# Patient Record
Sex: Female | Born: 1975 | Race: Black or African American | Hispanic: No | Marital: Single | Smoking: Never smoker
Health system: Southern US, Community
[De-identification: ages and names within clinical notes are randomized; demographics above are authoritative.]

## PROBLEM LIST (undated history)

## (undated) DIAGNOSIS — Z9289 Personal history of other medical treatment: Secondary | ICD-10-CM

## (undated) DIAGNOSIS — I10 Essential (primary) hypertension: Secondary | ICD-10-CM

## (undated) HISTORY — DX: Personal history of other medical treatment: Z92.89

## (undated) HISTORY — DX: Essential (primary) hypertension: I10

---

## 2009-12-10 ENCOUNTER — Emergency Department (HOSPITAL_BASED_OUTPATIENT_CLINIC_OR_DEPARTMENT_OTHER): Admission: EM | Admit: 2009-12-10 | Discharge: 2009-12-11 | Payer: Self-pay | Admitting: Emergency Medicine

## 2009-12-11 ENCOUNTER — Ambulatory Visit: Payer: Self-pay | Admitting: Radiology

## 2010-12-17 LAB — DIFFERENTIAL
Lymphocytes Relative: 26 % (ref 12–46)
Monocytes Absolute: 0.8 10*3/uL (ref 0.1–1.0)
Neutro Abs: 5.4 10*3/uL (ref 1.7–7.7)
Neutrophils Relative %: 56 % (ref 43–77)

## 2010-12-17 LAB — URINALYSIS, ROUTINE W REFLEX MICROSCOPIC
Glucose, UA: NEGATIVE mg/dL
Nitrite: NEGATIVE
Urobilinogen, UA: 1 mg/dL (ref 0.0–1.0)

## 2010-12-17 LAB — CBC
Hemoglobin: 11.7 g/dL — ABNORMAL LOW (ref 12.0–15.0)
MCHC: 32.9 g/dL (ref 30.0–36.0)
RBC: 3.96 MIL/uL (ref 3.87–5.11)
WBC: 9.6 10*3/uL (ref 4.0–10.5)

## 2010-12-17 LAB — WET PREP, GENITAL: Trich, Wet Prep: NONE SEEN

## 2010-12-17 LAB — GC/CHLAMYDIA PROBE AMP, GENITAL: Chlamydia, DNA Probe: NEGATIVE

## 2017-08-05 ENCOUNTER — Encounter (HOSPITAL_COMMUNITY): Payer: Self-pay | Admitting: Emergency Medicine

## 2017-08-05 ENCOUNTER — Ambulatory Visit (HOSPITAL_COMMUNITY)
Admission: EM | Admit: 2017-08-05 | Discharge: 2017-08-05 | Disposition: A | Discharge status: Home / Self Care | Payer: Medicaid Other | Attending: Emergency Medicine | Admitting: Emergency Medicine

## 2017-08-05 DIAGNOSIS — S39012A Strain of muscle, fascia and tendon of lower back, initial encounter: Secondary | ICD-10-CM | POA: Diagnosis not present

## 2017-08-05 DIAGNOSIS — M542 Cervicalgia: Secondary | ICD-10-CM | POA: Diagnosis not present

## 2017-08-05 DIAGNOSIS — S8002XA Contusion of left knee, initial encounter: Secondary | ICD-10-CM

## 2017-08-05 MED ORDER — NAPROXEN 500 MG PO TABS: 500.0000 mg | ORAL_TABLET | Freq: Two times a day (BID) | ORAL | 0 refills | Status: DC
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
## 2017-08-05 NOTE — Discharge Instructions (Signed)
If symptoms do not get better after 1 week call ortho  Take pain meds as needed May use ice and heat to knee

## 2017-08-06 ENCOUNTER — Encounter (HOSPITAL_COMMUNITY): Payer: Self-pay | Admitting: *Deleted

## 2017-08-06 ENCOUNTER — Emergency Department (HOSPITAL_COMMUNITY): Payer: No Typology Code available for payment source

## 2017-08-06 ENCOUNTER — Emergency Department (HOSPITAL_COMMUNITY)
Admission: EM | Admit: 2017-08-06 | Discharge: 2017-08-07 | Disposition: A | Discharge status: Home / Self Care | Payer: No Typology Code available for payment source | Attending: Emergency Medicine | Admitting: Emergency Medicine

## 2017-08-06 ENCOUNTER — Other Ambulatory Visit: Payer: Self-pay

## 2017-08-06 DIAGNOSIS — S161XXA Strain of muscle, fascia and tendon at neck level, initial encounter: Secondary | ICD-10-CM | POA: Diagnosis not present

## 2017-08-06 DIAGNOSIS — Y998 Other external cause status: Secondary | ICD-10-CM | POA: Insufficient documentation

## 2017-08-06 DIAGNOSIS — S8392XA Sprain of unspecified site of left knee, initial encounter: Secondary | ICD-10-CM | POA: Insufficient documentation

## 2017-08-06 DIAGNOSIS — Y92481 Parking lot as the place of occurrence of the external cause: Secondary | ICD-10-CM | POA: Diagnosis not present

## 2017-08-06 DIAGNOSIS — Y939 Activity, unspecified: Secondary | ICD-10-CM | POA: Diagnosis not present

## 2017-08-06 DIAGNOSIS — S199XXA Unspecified injury of neck, initial encounter: Secondary | ICD-10-CM | POA: Diagnosis present

## 2017-08-06 LAB — I-STAT BETA HCG BLOOD, ED (MC, WL, AP ONLY)

## 2017-08-06 MED ORDER — KETOROLAC TROMETHAMINE 60 MG/2ML IM SOLN
60.0000 mg | Freq: Once | INTRAMUSCULAR | Status: AC
  Administered 2017-08-06: 60 mg via INTRAMUSCULAR
  Filled 2017-08-06: qty 2

## 2017-08-06 MED ORDER — METHOCARBAMOL 500 MG PO TABS
1000.0000 mg | ORAL_TABLET | Freq: Once | ORAL | Status: AC
  Administered 2017-08-06: 1000 mg via ORAL
  Filled 2017-08-06: qty 2

## 2017-08-06 MED ORDER — METHOCARBAMOL 500 MG PO TABS: 1000.0000 mg | ORAL_TABLET | Freq: Three times a day (TID) | ORAL | 0 refills | Status: DC | PRN

## 2017-08-06 MED ORDER — TRAMADOL HCL 50 MG PO TABS: 50.0000 mg | ORAL_TABLET | Freq: Four times a day (QID) | ORAL | 0 refills | Status: DC | PRN
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
## 2017-08-06 NOTE — ED Notes (Signed)
Pt was seen at [facility] for the same yesterday, last took the naproxen prescribed this morning for the pain.

## 2017-08-06 NOTE — ED Notes (Signed)
Ortho tech aware of knee immobilizer and crutches.

## 2017-08-06 NOTE — ED Notes (Signed)
Patient verbalized understanding of discharge instructions and denies any further needs or questions at this time. VS stable. Patient ambulatory with steady gait. Escorted to ED entrance in wheelchair.   

## 2017-08-06 NOTE — ED Notes (Signed)
Ortho at bedside.
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
## 2017-08-06 NOTE — Progress Notes (Signed)
Orthopedic Tech Progress Note Patient Details:  Jennifer Bowers 06-30-1976 [id number]  Ortho Devices Type of Ortho Device: Knee Immobilizer, Crutches Ortho Device/Splint Location: lle Ortho Device/Splint Interventions: Ordered, Application, Adjustment   Karolee Stamps 08/06/2017, 11:59 PM

## 2017-11-06 ENCOUNTER — Ambulatory Visit (HOSPITAL_COMMUNITY)
Admission: EM | Admit: 2017-11-06 | Discharge: 2017-11-06 | Disposition: A | Discharge status: Home / Self Care | Payer: Medicaid Other | Attending: Internal Medicine | Admitting: Internal Medicine

## 2017-11-06 ENCOUNTER — Encounter (HOSPITAL_COMMUNITY): Payer: Self-pay | Admitting: Emergency Medicine

## 2017-11-06 ENCOUNTER — Other Ambulatory Visit: Payer: Self-pay

## 2017-11-06 DIAGNOSIS — R6889 Other general symptoms and signs: Secondary | ICD-10-CM

## 2017-11-06 MED ORDER — FLUTICASONE PROPIONATE 50 MCG/ACT NA SUSP: 2.0000 | Freq: Every day | NASAL | 0 refills | Status: DC

## 2017-11-06 MED ORDER — OSELTAMIVIR PHOSPHATE 75 MG PO CAPS: 75.0000 mg | ORAL_CAPSULE | Freq: Two times a day (BID) | ORAL | 0 refills | Status: AC

## 2017-11-06 MED ORDER — MELOXICAM 7.5 MG PO TABS: 7.5000 mg | ORAL_TABLET | Freq: Every day | ORAL | 0 refills | Status: DC

## 2017-11-06 NOTE — Discharge Instructions (Signed)
Start tamiflu for possible influenza. Mobic for pain. Start flonase for nasal congestion/drainage. You can use over the counter nasal saline rinse such as neti pot for nasal congestion. Keep hydrated, your urine should be clear to pale yellow in color. Tylenol for fever and pain. Monitor for any worsening of symptoms, chest pain, shortness of breath, wheezing, swelling of the throat, follow up for reevaluation.   For sore throat try using a honey-based tea. Use 3 teaspoons of honey with juice squeezed from half lemon. Place shaved pieces of ginger into 1/2-1 cup of water and warm over stove top. Then mix the ingredients and repeat every 4 hours as needed.

## 2017-11-06 NOTE — ED Provider Notes (Signed)
[facility]    CSN: [id number] Arrival date & time: 11/06/17  1626     History   Chief Complaint Chief Complaint  Patient presents with  . Otalgia    HPI Jennifer Bowers is a 42 y.o. female.   42 year old female comes in for 1 day history of flulike symptoms.  She has had nasal congestion, rhinorrhea, productive cough, body aches, bilateral ear pain, fever.  She is also had some dizziness with chest soreness with cough.  Denies syncope, seizures.  Denies one-sided weakness.  Denies chest pain, shortness of breath, wheezing.  Fever, T-max 103, has not taken any medications.  Positive sick contact.  Never smoker.      History reviewed. No pertinent past medical history.  There are no active problems to display for this patient.   History reviewed. No pertinent surgical history.  OB History    No data available       Home Medications    Prior to Admission medications   Medication Sig Start Date End Date Taking? Authorizing Provider  fluticasone (FLONASE) 50 MCG/ACT nasal spray Place 2 sprays into both nostrils daily. 11/06/17   Tasia Catchings, Antony Sian V, PA-C  meloxicam (MOBIC) 7.5 MG tablet Take 1 tablet (7.5 mg total) by mouth daily. 11/06/17   Tasia Catchings, Cervando Durnin V, PA-C  methocarbamol (ROBAXIN) 500 MG tablet Take 2 tablets (1,000 mg total) every 8 (eight) hours as needed by mouth for muscle spasms. 08/06/17   Julianne Rice, MD  naproxen (NAPROSYN) 500 MG tablet Take 1 tablet (500 mg total) 2 (two) times daily by mouth. 08/05/17   Marney Setting, NP  oseltamivir (TAMIFLU) 75 MG capsule Take 1 capsule (75 mg total) by mouth 2 (two) times daily for 5 days. 11/06/17 11/11/17  Ok Edwards, PA-C  traMADol (ULTRAM) 50 MG tablet Take 1 tablet (50 mg total) every 6 (six) hours as needed by mouth for severe pain. 08/06/17   Julianne Rice, MD    Family History Family History  Problem Relation Age of Onset  . Healthy Mother     Social History Social History   Tobacco Use  . Smoking  status: Never Smoker  Substance Use Topics  . Alcohol use: No    Frequency: Never  . Drug use: No     Allergies   Penicillins   Review of Systems Review of Systems  Reason unable to perform ROS: See HPI as above.     Physical Exam Triage Vital Signs ED Triage Vitals  Enc Vitals Group     BP 11/06/17 1735 (!) 141/93     Pulse Rate 11/06/17 1735 (!) 112     Resp 11/06/17 1735 (!) 24     Temp 11/06/17 1735 100.3 F (37.9 C)     Temp Source 11/06/17 1735 Oral     SpO2 11/06/17 1735 100 %     Weight --      Height --      Head Circumference --      Peak Flow --      Pain Score 11/06/17 1732 10     Pain Loc --      Pain Edu? --      Excl. in [place]? --    No data found.  Updated Vital Signs BP (!) 145/86 (BP Location: Right Arm)   Pulse (!) 115   Temp 100.3 F (37.9 C) (Oral)   Resp 20   SpO2 100%   Physical Exam  Constitutional: She is oriented  to person, place, and time. She appears well-developed and well-nourished. No distress.  HENT:  Head: Normocephalic and atraumatic.  Right Ear: External ear and ear canal normal. Tympanic membrane is erythematous. Tympanic membrane is not bulging.  Left Ear: External ear and ear canal normal. Tympanic membrane is erythematous. Tympanic membrane is not bulging.  Nose: Mucosal edema and rhinorrhea present. Right sinus exhibits maxillary sinus tenderness and frontal sinus tenderness. Left sinus exhibits maxillary sinus tenderness and frontal sinus tenderness.  Mouth/Throat: Uvula is midline, oropharynx is clear and moist and mucous membranes are normal.  Eyes: Conjunctivae and EOM are normal. Pupils are equal, round, and reactive to light.  Neck: Normal range of motion. Neck supple.  Cardiovascular: Regular rhythm and normal heart sounds. Tachycardia present. Exam reveals no gallop and no friction rub.  No murmur heard. Pulmonary/Chest: Effort normal and breath sounds normal. She has no decreased breath sounds. She has no  wheezes. She has no rhonchi. She has no rales.  Lymphadenopathy:    She has no cervical adenopathy.  Neurological: She is alert and oriented to person, place, and time. She has normal strength. She is not disoriented. Coordination and gait normal. GCS eye subscore is 4. GCS verbal subscore is 5. GCS motor subscore is 6.  Skin: Skin is warm and dry.  Psychiatric: She has a normal mood and affect. Her behavior is normal. Judgment normal.    UC Treatments / Results  Labs (all labs ordered are listed, but only abnormal results are displayed) Labs Reviewed - No data to display  EKG  EKG Interpretation None       Radiology No results found.  Procedures Procedures (including critical care time)  Medications Ordered in UC Medications - No data to display   Initial Impression / Assessment and Plan / UC Course  I have reviewed the triage vital signs and the nursing notes.  Pertinent labs & imaging results that were available during my care of the patient were reviewed by me and considered in my medical decision making (see chart for details).    History and exam consistent with flulike illness.  Patient would like to proceed with Tamiflu treatment.  Start Tamiflu as directed.  Other symptomatic treatment discussed.  Push fluids.  Return precautions given.  Patient expresses understanding and agrees to plan.  Final Clinical Impressions(s) / UC Diagnoses   Final diagnoses:  Flu-like symptoms    ED Discharge Orders        Ordered    oseltamivir (TAMIFLU) 75 MG capsule  2 times daily     11/06/17 1857    fluticasone (FLONASE) 50 MCG/ACT nasal spray  Daily     11/06/17 1857    meloxicam (MOBIC) 7.5 MG tablet  Daily     11/06/17 1857        Ok Edwards, PA-C 11/06/17 2154

## 2017-11-06 NOTE — ED Triage Notes (Signed)
Fever and bilateral ear pain and reports fever of 103.  Dizziness, stuffy nose, chest soreness with cough

## 2018-05-05 ENCOUNTER — Emergency Department (HOSPITAL_COMMUNITY): Payer: Medicaid Other

## 2018-05-05 ENCOUNTER — Encounter (HOSPITAL_COMMUNITY): Payer: Self-pay | Admitting: *Deleted

## 2018-05-05 ENCOUNTER — Encounter (HOSPITAL_COMMUNITY): Payer: Self-pay

## 2018-05-05 ENCOUNTER — Emergency Department (HOSPITAL_COMMUNITY)
Admission: EM | Admit: 2018-05-05 | Discharge: 2018-05-05 | Disposition: A | Discharge status: Home / Self Care | Payer: Medicaid Other | Attending: Emergency Medicine | Admitting: Emergency Medicine

## 2018-05-05 ENCOUNTER — Ambulatory Visit (HOSPITAL_COMMUNITY)
Admission: EM | Admit: 2018-05-05 | Discharge: 2018-05-05 | Disposition: A | Discharge status: Home / Self Care | Payer: Medicaid Other | Attending: Family Medicine | Admitting: Family Medicine

## 2018-05-05 DIAGNOSIS — R519 Headache, unspecified: Secondary | ICD-10-CM

## 2018-05-05 DIAGNOSIS — R51 Headache: Secondary | ICD-10-CM

## 2018-05-05 LAB — CBC WITH DIFFERENTIAL/PLATELET
ABS IMMATURE GRANULOCYTES: 0 10*3/uL (ref 0.0–0.1)
BASOS ABS: 0.1 10*3/uL (ref 0.0–0.1)
BASOS PCT: 1 %
Eosinophils Absolute: 0.3 10*3/uL (ref 0.0–0.7)
Eosinophils Relative: 3 %
HCT: 30.7 % — ABNORMAL LOW (ref 36.0–46.0)
HEMOGLOBIN: 9 g/dL — AB (ref 12.0–15.0)
IMMATURE GRANULOCYTES: 0 %
Lymphocytes Relative: 27 %
Lymphs Abs: 2.7 10*3/uL (ref 0.7–4.0)
MCH: 23 pg — AB (ref 26.0–34.0)
MCHC: 29.3 g/dL — ABNORMAL LOW (ref 30.0–36.0)
MCV: 78.3 fL (ref 78.0–100.0)
MONO ABS: 0.9 10*3/uL (ref 0.1–1.0)
Monocytes Relative: 9 %
NEUTROS ABS: 5.8 10*3/uL (ref 1.7–7.7)
NEUTROS PCT: 60 %
PLATELETS: 504 10*3/uL — AB (ref 150–400)
RBC: 3.92 MIL/uL (ref 3.87–5.11)
RDW: 17.1 % — ABNORMAL HIGH (ref 11.5–15.5)
WBC: 9.8 10*3/uL (ref 4.0–10.5)

## 2018-05-05 LAB — COMPREHENSIVE METABOLIC PANEL
ALBUMIN: 3.4 g/dL — AB (ref 3.5–5.0)
ALT: 16 U/L (ref 0–44)
AST: 23 U/L (ref 15–41)
Alkaline Phosphatase: 56 U/L (ref 38–126)
Anion gap: 9 (ref 5–15)
BUN: 11 mg/dL (ref 6–20)
CHLORIDE: 108 mmol/L (ref 98–111)
CO2: 22 mmol/L (ref 22–32)
CREATININE: 0.89 mg/dL (ref 0.44–1.00)
Calcium: 8.9 mg/dL (ref 8.9–10.3)
GFR calc non Af Amer: >60 mL/min (ref >60)
GLUCOSE: 89 mg/dL (ref 70–99)
Potassium: 4.2 mmol/L (ref 3.5–5.1)
SODIUM: 139 mmol/L (ref 135–145)
Total Bilirubin: 0.5 mg/dL (ref 0.3–1.2)
Total Protein: 7 g/dL (ref 6.5–8.1)

## 2018-05-05 LAB — I-STAT BETA HCG BLOOD, ED (MC, WL, AP ONLY): I-stat hCG, quantitative: <5 m[IU]/mL (ref <5)

## 2018-05-05 MED ORDER — DIPHENHYDRAMINE HCL 50 MG/ML IJ SOLN
25.0000 mg | Freq: Once | INTRAMUSCULAR | Status: AC
  Administered 2018-05-05: 25 mg via INTRAVENOUS
  Filled 2018-05-05: qty 1

## 2018-05-05 MED ORDER — KETOROLAC TROMETHAMINE 30 MG/ML IJ SOLN
30.0000 mg | Freq: Once | INTRAMUSCULAR | Status: AC
  Administered 2018-05-05: 30 mg via INTRAVENOUS
  Filled 2018-05-05: qty 1

## 2018-05-05 MED ORDER — TRAMADOL HCL 50 MG PO TABS: ORAL_TABLET | ORAL | 0 refills | Status: DC

## 2018-05-05 MED ORDER — METOCLOPRAMIDE HCL 5 MG/ML IJ SOLN
10.0000 mg | Freq: Once | INTRAMUSCULAR | Status: AC
  Administered 2018-05-05: 10 mg via INTRAVENOUS
  Filled 2018-05-05: qty 2

## 2018-05-05 NOTE — ED Triage Notes (Signed)
Pt  Presents with ongoing headache, fatigue and general itchiness

## 2018-05-05 NOTE — Discharge Instructions (Addendum)
Recommend further evaluation and management at the ED Patient and mother in agreement with this plan.  Going to ED by private vehicle.

## 2018-05-05 NOTE — ED Notes (Signed)
Signature pad unavailable at time of pt discharge. Pt verbalized understanding of d/c instructions. Work note given. Denied any further requests

## 2018-05-05 NOTE — ED Notes (Signed)
Per catherine, RN lab reports CMP hemolyzed, additional samples sent

## 2018-05-05 NOTE — ED Notes (Signed)
ED Provider at bedside. 

## 2018-05-05 NOTE — ED Provider Notes (Signed)
[facility] EMERGENCY DEPARTMENT Provider Note   CSN: [id number] Arrival date & time: 05/05/18  1359     History   Chief Complaint Chief Complaint  Patient presents with  . Headache    HPI Jennifer Bowers is a 42 y.o. female.  Patient complains of severe headache for last couple days.  For the next liver had some nausea.  No blurred vision  The history is provided by the patient. No language interpreter was used.  Headache   This is a new problem. The current episode started yesterday. The problem occurs constantly. The problem has not changed since onset.The headache is associated with nothing. The pain is located in the bilateral region. The quality of the pain is described as dull. The pain is at a severity of 5/10. The pain is moderate. The pain does not radiate. Pertinent negatives include no anorexia. She has tried nothing for the symptoms. The treatment provided no relief.    History reviewed. No pertinent past medical history.  There are no active problems to display for this patient.   History reviewed. No pertinent surgical history.   OB History   None      Home Medications    Prior to Admission medications   Medication Sig Start Date End Date Taking? Authorizing Provider  fluticasone (FLONASE) 50 MCG/ACT nasal spray Place 2 sprays into both nostrils daily. Patient not taking: Reported on 05/05/2018 11/06/17   Ok Edwards, PA-C  meloxicam (MOBIC) 7.5 MG tablet Take 1 tablet (7.5 mg total) by mouth daily. Patient not taking: Reported on 05/05/2018 11/06/17   Ok Edwards, PA-C  methocarbamol (ROBAXIN) 500 MG tablet Take 2 tablets (1,000 mg total) every 8 (eight) hours as needed by mouth for muscle spasms. Patient not taking: Reported on 05/05/2018 08/06/17   Julianne Rice, MD  naproxen (NAPROSYN) 500 MG tablet Take 1 tablet (500 mg total) 2 (two) times daily by mouth. Patient not taking: Reported on 05/05/2018 08/05/17   Marney Setting, NP    traMADol Veatrice Bourbon) 50 MG tablet Take 1 every 8 hours if needed for headache not relieved by Tylenol 05/05/18   Milton Ferguson, MD    Family History Family History  Problem Relation Age of Onset  . Healthy Mother     Social History Social History   Tobacco Use  . Smoking status: Never Smoker  Substance Use Topics  . Alcohol use: No    Frequency: Never  . Drug use: No     Allergies   Penicillins   Review of Systems Review of Systems  Constitutional: Negative for appetite change and fatigue.  HENT: Negative for congestion, ear discharge and sinus pressure.   Eyes: Negative for discharge.  Respiratory: Negative for cough.   Cardiovascular: Negative for chest pain.  Gastrointestinal: Negative for abdominal pain, anorexia and diarrhea.  Genitourinary: Negative for frequency and hematuria.  Musculoskeletal: Negative for back pain.  Skin: Negative for rash.  Neurological: Positive for headaches. Negative for seizures.  Psychiatric/Behavioral: Negative for hallucinations.     Physical Exam Updated Vital Signs BP 120/83 (BP Location: Left Arm)   Pulse 72   Temp 98.5 F (36.9 C) (Oral)   Resp 16   SpO2 98%   Physical Exam  Constitutional: She is oriented to person, place, and time. She appears well-developed.  HENT:  Head: Normocephalic.  Eyes: Conjunctivae and EOM are normal. No scleral icterus.  Neck: Neck supple. No thyromegaly present.  Cardiovascular: Normal rate and regular rhythm.  Exam reveals no gallop and no friction rub.  No murmur heard. Pulmonary/Chest: No stridor. She has no wheezes. She has no rales. She exhibits no tenderness.  Abdominal: She exhibits no distension. There is no tenderness. There is no rebound.  Musculoskeletal: Normal range of motion. She exhibits no edema.  Lymphadenopathy:    She has no cervical adenopathy.  Neurological: She is oriented to person, place, and time. She exhibits normal muscle tone. Coordination normal.  Skin: No  rash noted. No erythema.  Psychiatric: She has a normal mood and affect. Her behavior is normal.     ED Treatments / Results  Labs (all labs ordered are listed, but only abnormal results are displayed) Labs Reviewed  CBC WITH DIFFERENTIAL/PLATELET - Abnormal; Notable for the following components:      Result Value   Hemoglobin 9.0 (*)    HCT 30.7 (*)    MCH 23.0 (*)    MCHC 29.3 (*)    RDW 17.1 (*)    Platelets 504 (*)    All other components within normal limits  COMPREHENSIVE METABOLIC PANEL - Abnormal; Notable for the following components:   Albumin 3.4 (*)    All other components within normal limits  I-STAT BETA HCG BLOOD, ED ([facility], [facility], [facility] ONLY)    EKG None  Radiology Ct Head Wo Contrast  Result Date: 05/05/2018 CLINICAL DATA:  Acute onset severe headache.  Fatigue. EXAM: CT HEAD WITHOUT CONTRAST TECHNIQUE: Contiguous axial images were obtained from the base of the skull through the vertex without intravenous contrast. COMPARISON:  None. FINDINGS: Brain: The ventricles are normal in size and configuration. There is no intracranial mass, hemorrhage, extra-axial fluid collection, or midline shift. The gray-white compartments are normal. No evident acute infarct. Minimal basal ganglia calcification is likely physiologic. Vascular: No hyperdense vessel.  No evident vascular calcification. Skull: The bony calvarium appears intact. Sinuses/Orbits: There is a retention cyst in the inferior right maxillary antrum. There is mucosal thickening in several ethmoid air cells. There is mucosal thickening in the right sphenoid sinus. Other visualized paranasal sinuses are clear. Orbits appear symmetric bilaterally. Other: Mastoid air cells are clear. IMPRESSION: Areas of paranasal sinus disease.  Study otherwise unremarkable. Electronically Signed   By: Lowella Grip III M.D.   On: 05/05/2018 17:15    Procedures Procedures (including critical care time)  Medications Ordered in  ED Medications  ketorolac (TORADOL) 30 MG/ML injection 30 mg (30 mg Intravenous Given 05/05/18 1722)  diphenhydrAMINE (BENADRYL) injection 25 mg (25 mg Intravenous Given 05/05/18 1722)  metoCLOPramide (REGLAN) injection 10 mg (10 mg Intravenous Given 05/05/18 1722)     Initial Impression / Assessment and Plan / ED Course  I have reviewed the triage vital signs and the nursing notes.  Pertinent labs & imaging results that were available during my care of the patient were reviewed by me and considered in my medical decision making (see chart for details).     CBC see med CT had all unremarkable except for cysts in her sinuses.  Patient improved with migraine cocktail.  She will follow-up with ENT and is given some Ultram for pain if needed  Final Clinical Impressions(s) / ED Diagnoses   Final diagnoses:  Bad headache    ED Discharge Orders         Ordered    traMADol (ULTRAM) 50 MG tablet     05/05/18 2023           Milton Ferguson, MD 05/05/18 2025

## 2018-05-05 NOTE — Discharge Instructions (Signed)
Follow-up with Dr. Erik Obey so he can evaluate your sinuses.Marland Kitchen

## 2018-05-05 NOTE — ED Provider Notes (Addendum)
[facility]   [id number] 05/05/18 Arrival Time: 1303  SUBJECTIVE:  Jennifer Bowers is a 42 y.o. female who complains of migraine for 3-4 days.  Denies a precipitating event, or recent head trauma.  Patient localizes her pain to the temples.  Describes the pain as intermittent and sharp in character.  Patient states pain is a 10/10. Patient has tried OTC ibuprofen without relief. Symptoms are made worse with daily activies.  Denies similar symptoms in the past.  This is the worst headache of their life.  Complains of associated fatigue, nausea, vomiting x 3 episodes, watery eyes, and SOB.  Patient denies fever, chills, aura, rhinorrhea, chest pain, weakness, numbness or tingling.    ROS: As per HPI.  History reviewed. No pertinent past medical history. History reviewed. No pertinent surgical history. Allergies  Allergen Reactions  . Penicillins    No current facility-administered medications on file prior to encounter.    Current Outpatient Medications on File Prior to Encounter  Medication Sig Dispense Refill  . fluticasone (FLONASE) 50 MCG/ACT nasal spray Place 2 sprays into both nostrils daily. 1 g 0  . meloxicam (MOBIC) 7.5 MG tablet Take 1 tablet (7.5 mg total) by mouth daily. 15 tablet 0  . methocarbamol (ROBAXIN) 500 MG tablet Take 2 tablets (1,000 mg total) every 8 (eight) hours as needed by mouth for muscle spasms. 30 tablet 0  . naproxen (NAPROSYN) 500 MG tablet Take 1 tablet (500 mg total) 2 (two) times daily by mouth. 30 tablet 0  . traMADol (ULTRAM) 50 MG tablet Take 1 tablet (50 mg total) every 6 (six) hours as needed by mouth for severe pain. 15 tablet 0   Social History   Socioeconomic History  . Marital status: Single    Spouse name: Not on file  . Number of children: Not on file  . Years of education: Not on file  . Highest education level: Not on file  Occupational History  . Not on file  Social Needs  . Financial resource strain: Not on file  . Food  insecurity:    Worry: Not on file    Inability: Not on file  . Transportation needs:    Medical: Not on file    Non-medical: Not on file  Tobacco Use  . Smoking status: Never Smoker  Substance and Sexual Activity  . Alcohol use: No    Frequency: Never  . Drug use: No  . Sexual activity: Not on file  Lifestyle  . Physical activity:    Days per week: Not on file    Minutes per session: Not on file  . Stress: Not on file  Relationships  . Social connections:    Talks on phone: Not on file    Gets together: Not on file    Attends religious service: Not on file    Active member of club or organization: Not on file    Attends meetings of clubs or organizations: Not on file    Relationship status: Not on file  . Intimate partner violence:    Fear of current or ex partner: Not on file    Emotionally abused: Not on file    Physically abused: Not on file    Forced sexual activity: Not on file  Other Topics Concern  . Not on file  Social History Narrative  . Not on file   Family History  Problem Relation Age of Onset  . Healthy Mother     OBJECTIVE:  Vitals:  05/05/18 1320  BP: 130/81  Pulse: 93  Resp: 20  Temp: 98.1 F (36.7 C)  TempSrc: Oral  SpO2: 100%    General appearance: AO to person, place, and date; in on obvious distress; using mobile device without difficulty Eyes: PERRLA; EOMI HENT: normocephalic; atraumatic Neck: supple with FROM Lungs: clear to auscultation bilaterally Heart: Systolic murmur heard.  Radial pulses 2+ symmetrical bilaterally Abdomen: nondistended; soft; normal active bowel sounds; nontender to light or deep palpation; no guarding Extremities: no edema; symmetrical with no gross deformities Skin: warm and dry Neurologic: CN 2-12 grossly intact; finger to nose without difficulty; negative pronator drift; normal gait; strength and sensation intact bilaterally about the upper and lower extremities Psychological: alert and cooperative;  normal mood and affect  ASSESSMENT & PLAN:  1. Worst headache of life     No orders of the defined types were placed in this encounter.  Due to patient description of headache I am recommending further evaluation and management in the ED Patient and mother in agreement with this plan.  Going to ED by private vehicle.      Lestine Box, PA-C 05/05/18 1349

## 2018-05-05 NOTE — ED Triage Notes (Signed)
Pt in c/o intermittent headache for the last two days, pain is sharp, also nausea with this, denies photophobia

## 2018-05-05 NOTE — ED Notes (Signed)
Patient transported to CT 

## 2019-12-30 ENCOUNTER — Encounter (HOSPITAL_COMMUNITY): Payer: Self-pay

## 2019-12-30 ENCOUNTER — Other Ambulatory Visit: Payer: Self-pay

## 2019-12-30 ENCOUNTER — Ambulatory Visit (HOSPITAL_COMMUNITY)
Admission: EM | Admit: 2019-12-30 | Discharge: 2019-12-30 | Disposition: A | Discharge status: Home / Self Care | Payer: Medicaid Other | Attending: Family Medicine | Admitting: Family Medicine

## 2019-12-30 DIAGNOSIS — Z20822 Contact with and (suspected) exposure to covid-19: Secondary | ICD-10-CM

## 2019-12-30 DIAGNOSIS — Z1152 Encounter for screening for COVID-19: Secondary | ICD-10-CM

## 2019-12-30 DIAGNOSIS — U071 COVID-19: Secondary | ICD-10-CM | POA: Diagnosis not present

## 2019-12-30 NOTE — ED Triage Notes (Signed)
Patient significant other tested positive for COVID today.

## 2019-12-30 NOTE — Discharge Instructions (Addendum)
Your COVID test is pending.  You should self quarantine until the test result is back.    Take Tylenol as needed for fever or discomfort.  Rest and keep yourself hydrated.    Go to the emergency department if you develop shortness of breath, severe diarrhea, high fever not relieved by Tylenol or ibuprofen, or other concerning symptoms.    

## 2019-12-30 NOTE — ED Provider Notes (Signed)
[facility]    CSN: [id number] Arrival date & time: 12/30/19  1645      History   Chief Complaint Chief Complaint  Patient presents with  . Covid Exposure    HPI Jennifer Bowers is a 44 y.o. female.   Patient reports that her significant other was Covid tested and was reported positive today.  Patient states that her significant other was tested about 3 days ago.  Reports that she had been around him before this, and that he was symptomatic with fever, cough, fatigue, weakness.  Patient denies any symptoms today.  Denies headache, cough, shortness of breath, chest tightness, nausea, vomiting, diarrhea, fever, rash, other symptoms.  ROS per HPI  The history is provided by the patient.    History reviewed. No pertinent past medical history.  There are no problems to display for this patient.   History reviewed. No pertinent surgical history.  OB History   No obstetric history on file.      Home Medications    Prior to Admission medications   Medication Sig Start Date End Date Taking? Authorizing Provider  fluticasone (FLONASE) 50 MCG/ACT nasal spray Place 2 sprays into both nostrils daily. Patient not taking: Reported on 05/05/2018 11/06/17   Ok Edwards, PA-C  meloxicam (MOBIC) 7.5 MG tablet Take 1 tablet (7.5 mg total) by mouth daily. Patient not taking: Reported on 05/05/2018 11/06/17   Ok Edwards, PA-C  methocarbamol (ROBAXIN) 500 MG tablet Take 2 tablets (1,000 mg total) every 8 (eight) hours as needed by mouth for muscle spasms. Patient not taking: Reported on 05/05/2018 08/06/17   Julianne Rice, MD  naproxen (NAPROSYN) 500 MG tablet Take 1 tablet (500 mg total) 2 (two) times daily by mouth. Patient not taking: Reported on 05/05/2018 08/05/17   Marney Setting, NP  traMADol Veatrice Bourbon) 50 MG tablet Take 1 every 8 hours if needed for headache not relieved by Tylenol 05/05/18   Milton Ferguson, MD    Family History Family History  Problem Relation Age of  Onset  . Healthy Mother     Social History Social History   Tobacco Use  . Smoking status: Never Smoker  . Smokeless tobacco: Never Used  Substance Use Topics  . Alcohol use: No  . Drug use: No     Allergies   Penicillins   Review of Systems Review of Systems   Physical Exam Triage Vital Signs ED Triage Vitals  Enc Vitals Group     BP 12/30/19 1710 128/88     Pulse Rate 12/30/19 1710 88     Resp 12/30/19 1710 14     Temp 12/30/19 1710 97.8 F (36.6 C)     Temp Source 12/30/19 1710 Oral     SpO2 12/30/19 1710 100 %     Weight --      Height --      Head Circumference --      Peak Flow --      Pain Score 12/30/19 1709 0     Pain Loc --      Pain Edu? --      Excl. in [place]? --    No data found.  Updated Vital Signs BP 128/88 (BP Location: Left Arm)   Pulse 88   Temp 97.8 F (36.6 C) (Oral)   Resp 14   LMP 12/04/2019 (Exact Date)   SpO2 100%   Visual Acuity Right Eye Distance:   Left Eye Distance:   Bilateral Distance:  Right Eye Near:   Left Eye Near:    Bilateral Near:     Physical Exam Vitals and nursing note reviewed.  Constitutional:      General: She is not in acute distress.    Appearance: Normal appearance. She is well-developed. She is not ill-appearing.  HENT:     Head: Normocephalic and atraumatic.     Right Ear: Tympanic membrane normal.     Left Ear: Tympanic membrane normal.     Nose: Nose normal.     Mouth/Throat:     Mouth: Mucous membranes are moist.     Pharynx: Oropharynx is clear.  Eyes:     Extraocular Movements: Extraocular movements intact.     Conjunctiva/sclera: Conjunctivae normal.     Pupils: Pupils are equal, round, and reactive to light.  Cardiovascular:     Rate and Rhythm: Normal rate and regular rhythm.     Heart sounds: No murmur.  Pulmonary:     Effort: Pulmonary effort is normal. No respiratory distress.     Breath sounds: Normal breath sounds.  Abdominal:     General: Bowel sounds are normal. There  is no distension.     Palpations: Abdomen is soft. There is no mass.     Tenderness: There is no abdominal tenderness. There is no right CVA tenderness, left CVA tenderness, guarding or rebound.     Hernia: No hernia is present.  Musculoskeletal:        General: Normal range of motion.     Cervical back: Normal range of motion and neck supple.  Skin:    General: Skin is warm and dry.     Capillary Refill: Capillary refill takes less than 2 seconds.  Neurological:     General: No focal deficit present.     Mental Status: She is alert and oriented to person, place, and time.  Psychiatric:        Mood and Affect: Mood normal.        Behavior: Behavior normal.        Thought Content: Thought content normal.      UC Treatments / Results  Labs (all labs ordered are listed, but only abnormal results are displayed) Labs Reviewed  SARS CORONAVIRUS 2 (TAT 6-24 HRS)    EKG   Radiology No results found.  Procedures Procedures (including critical care time)  Medications Ordered in UC Medications - No data to display  Initial Impression / Assessment and Plan / UC Course  I have reviewed the triage vital signs and the nursing notes.  Pertinent labs & imaging results that were available during my care of the patient were reviewed by me and considered in my medical decision making (see chart for details).     Covid screen: Patient has close exposure to COVID-19 virus.  Significant other tested positive for Covid today.  Patient is a symptomatic, physical exam benign.  Covid swab obtained in office today, patient instructed to quarantine until results are back and negative.  Instructed patient in negative results will be available on MyChart.  Also instructed that if her results are positive, she will usually hear from our office.  Instructed to follow-up with primary care as needed.  Instructed to follow-up with the ER for trouble swallowing, trouble breathing, other concerning  symptoms. Final Clinical Impressions(s) / UC Diagnoses   Final diagnoses:  Close exposure to COVID-19 virus  Encounter for screening for COVID-19     Discharge Instructions     Your COVID  test is pending.  You should self quarantine until the test result is back.    Take Tylenol as needed for fever or discomfort.  Rest and keep yourself hydrated.    Go to the emergency department if you develop shortness of breath, severe diarrhea, high fever not relieved by Tylenol or ibuprofen, or other concerning symptoms.       ED Prescriptions    None     PDMP not reviewed this encounter.   Faustino Congress, NP 12/30/19 1753

## 2019-12-31 LAB — SARS CORONAVIRUS 2 (TAT 6-24 HRS): SARS Coronavirus 2: POSITIVE — AB

## 2020-01-18 ENCOUNTER — Ambulatory Visit (HOSPITAL_COMMUNITY)
Admission: EM | Admit: 2020-01-18 | Discharge: 2020-01-18 | Discharge status: Absent without leave | Payer: Medicaid Other

## 2020-03-08 ENCOUNTER — Ambulatory Visit (HOSPITAL_COMMUNITY)
Admission: EM | Admit: 2020-03-08 | Discharge: 2020-03-08 | Disposition: A | Discharge status: Home / Self Care | Payer: Self-pay | Attending: Family Medicine | Admitting: Family Medicine

## 2020-03-08 ENCOUNTER — Encounter (HOSPITAL_COMMUNITY): Payer: Self-pay | Admitting: Emergency Medicine

## 2020-03-08 ENCOUNTER — Other Ambulatory Visit: Payer: Self-pay

## 2020-03-08 DIAGNOSIS — G500 Trigeminal neuralgia: Secondary | ICD-10-CM

## 2020-03-08 MED ORDER — PREDNISONE 10 MG (21) PO TBPK: ORAL_TABLET | Freq: Every day | ORAL | 0 refills | Status: DC

## 2020-03-08 NOTE — ED Triage Notes (Signed)
History of TMJ 2 months ago.  Patient has had right jaw pain and ear pain for 2 months.  Medication did not help pain

## 2020-03-08 NOTE — Discharge Instructions (Addendum)
We will try to treat the pain and inflammation with long prednisone taper. You may need stronger medication in the future. This may be short term fix.  You need to see your primary care for possible referral to neurology.

## 2020-03-09 NOTE — ED Provider Notes (Signed)
Jennifer Bowers    CSN: [id number] Arrival date & time: 03/08/20  0913      History   Chief Complaint Chief Complaint  Patient presents with   Otalgia    HPI Jennifer Bowers is a 44 y.o. female.   Pt is a 44 year old female that presents with right ear pain, jaw pain, facial pain. This has been ongoing x 2 months. This has been intermittent. Describes as sharp and stabbing when the pain comes. She has been taking ibuprofen which helps some from time to time. No facial swelling, tooth pain or fevers. Was told possible TMJ. No specific pain with chewing, opening the mouth.   ROS per HPI      History reviewed. No pertinent past medical history.  There are no problems to display for this patient.   History reviewed. No pertinent surgical history.  OB History   No obstetric history on file.      Home Medications    Prior to Admission medications   Medication Sig Start Date End Date Taking? Authorizing Provider  predniSONE (STERAPRED UNI-PAK 21 TAB) 10 MG (21) TBPK tablet Take by mouth daily. Take 6 tabs by mouth daily  for 2 days, then 5 tabs for 2 days, then 4 tabs for 2 days, then 3 tabs for 2 days, 2 tabs for 2 days, then 1 tab by mouth daily for 2 days 03/08/20   Loura Halt A, NP  fluticasone (FLONASE) 50 MCG/ACT nasal spray Place 2 sprays into both nostrils daily. Patient not taking: Reported on 05/05/2018 11/06/17 03/08/20  Arturo Morton    Family History Family History  Problem Relation Age of Onset   Healthy Mother     Social History Social History   Tobacco Use   Smoking status: Never Smoker   Smokeless tobacco: Never Used  Scientific laboratory technician Use: Never used  Substance Use Topics   Alcohol use: No   Drug use: No     Allergies   Penicillins   Review of Systems Review of Systems   Physical Exam Triage Vital Signs ED Triage Vitals  Enc Vitals Group     BP 03/08/20 0941 (!) 144/80     Pulse Rate 03/08/20 0941 90     Resp  03/08/20 0941 18     Temp 03/08/20 0941 97.7 F (36.5 C)     Temp Source 03/08/20 0941 Oral     SpO2 03/08/20 0941 100 %     Weight --      Height --      Head Circumference --      Peak Flow --      Pain Score 03/08/20 0943 10     Pain Loc --      Pain Edu? --      Excl. in [place]? --    No data found.  Updated Vital Signs BP (!) 144/80 (BP Location: Left Arm)    Pulse 90    Temp 97.7 F (36.5 C) (Oral)    Resp 18    LMP 02/25/2020    SpO2 100%   Visual Acuity Right Eye Distance:   Left Eye Distance:   Bilateral Distance:    Right Eye Near:   Left Eye Near:    Bilateral Near:     Physical Exam Vitals and nursing note reviewed.  Constitutional:      General: She is not in acute distress.    Appearance: Normal appearance. She is  not ill-appearing, toxic-appearing or diaphoretic.  HENT:     Head: Normocephalic.     Nose: Nose normal.     Mouth/Throat:     Comments: No specific tenderness with palpation of the TMJ Cracking, popping No facial swelling  Eyes:     Conjunctiva/sclera: Conjunctivae normal.  Pulmonary:     Effort: Pulmonary effort is normal.  Musculoskeletal:        General: Normal range of motion.     Cervical back: Normal range of motion.  Skin:    General: Skin is warm and dry.     Findings: No rash.  Neurological:     Mental Status: She is alert.  Psychiatric:        Mood and Affect: Mood normal.      UC Treatments / Results  Labs (all labs ordered are listed, but only abnormal results are displayed) Labs Reviewed - No data to display  EKG   Radiology No results found.  Procedures Procedures (including critical care time)  Medications Ordered in UC Medications - No data to display  Initial Impression / Assessment and Plan / UC Course  I have reviewed the triage vital signs and the nursing notes.  Pertinent labs & imaging results that were available during my care of the patient were reviewed by me and considered in my medical  decision making (see chart for details).     Trigeminal neuralgia.  Most likely diagnosis.  Do not believe this is TMJ syndrome We will try to treat this with long prednisone taper Inform patient that she needs to see a specialist and she may need different kind of medication to treat this. Recommended follow-up with primary care Information given about trigeminal neuralgia  Follow up as needed for continued or worsening symptoms  Final Clinical Impressions(s) / UC Diagnoses   Final diagnoses:  Trigeminal neuralgia of right side of face     Discharge Instructions     We will try to treat the pain and inflammation with long prednisone taper. You may need stronger medication in the future. This may be short term fix.  You need to see your primary care for possible referral to neurology.     ED Prescriptions    Medication Sig Dispense Auth. Provider   predniSONE (STERAPRED UNI-PAK 21 TAB) 10 MG (21) TBPK tablet Take by mouth daily. Take 6 tabs by mouth daily  for 2 days, then 5 tabs for 2 days, then 4 tabs for 2 days, then 3 tabs for 2 days, 2 tabs for 2 days, then 1 tab by mouth daily for 2 days 42 tablet Phillp Dolores A, NP     PDMP not reviewed this encounter.   Orvan July, NP 03/09/20 [phone]

## 2020-05-26 LAB — CYTOLOGY - PAP: Pap: NEGATIVE

## 2020-05-30 ENCOUNTER — Other Ambulatory Visit: Payer: Self-pay | Admitting: Obstetrics and Gynecology

## 2020-05-31 ENCOUNTER — Other Ambulatory Visit: Payer: Self-pay | Admitting: Obstetrics and Gynecology

## 2020-06-01 ENCOUNTER — Inpatient Hospital Stay (HOSPITAL_COMMUNITY): Admission: RE | Admit: 2020-06-01 | Payer: Medicaid Other | Source: Ambulatory Visit

## 2020-08-24 ENCOUNTER — Telehealth: Payer: Self-pay | Admitting: Medical

## 2020-08-24 NOTE — Telephone Encounter (Signed)
Pt called in and had to resch her appt from 08-25-20 to 09-01-20 due to appt in [place], [place] for a blood transfusion 08-25-20 at 730am.

## 2020-08-25 ENCOUNTER — Encounter: Payer: Self-pay | Admitting: Obstetrics & Gynecology

## 2020-08-30 ENCOUNTER — Encounter: Payer: Self-pay | Admitting: *Deleted

## 2020-09-01 ENCOUNTER — Ambulatory Visit (INDEPENDENT_AMBULATORY_CARE_PROVIDER_SITE_OTHER): Payer: Self-pay | Admitting: Medical

## 2020-09-01 ENCOUNTER — Other Ambulatory Visit: Payer: Self-pay

## 2020-09-01 ENCOUNTER — Encounter: Payer: Self-pay | Admitting: Medical

## 2020-09-01 VITALS — BP 152/107 | HR 96 | Ht 68.0 in | Wt 251.0 lb

## 2020-09-01 DIAGNOSIS — D219 Benign neoplasm of connective and other soft tissue, unspecified: Secondary | ICD-10-CM

## 2020-09-01 DIAGNOSIS — N939 Abnormal uterine and vaginal bleeding, unspecified: Secondary | ICD-10-CM

## 2020-09-01 NOTE — Progress Notes (Signed)
   History:  Ms. Jennifer Bowers is a 44 y.o. [id number] who presents to clinic today for AUB. Patient was seen initially at [facility] and decided to leave the practice due to concerns with the provider. She went to [place] and had a normal pap smear, GC/CHlamydia testing, TSH this year. She could no longer go to the practice due to her insurance. She states she has known large fibroids which were noted on [place] in 2011 (in [place]) and she has very heavy bleeding for 2 days each month with large clots. During the last 3-4 months her periods have also become irregular. She was unable to tolerate Ebx at [facility]. She had blood transfusion on 11/29 for Hgb 5.9. She tried OCPs for a short time and felt it helped some with bleeding control, but she did not like the side effects.   The following portions of the patient's history were reviewed and updated as appropriate: allergies, current medications, family history, past medical history, social history, past surgical history and problem list.  Review of Systems:  Review of Systems  Constitutional: Positive for malaise/fatigue. Negative for chills and fever.  Gastrointestinal: Negative for abdominal pain.  Genitourinary:       + vaginal bleeding      Objective:  Physical Exam BP (!) 152/107   Pulse 96   Ht 5\' 8"  (1.727 m)   Wt 251 lb (113.9 kg)   LMP 09/01/2020   BMI 38.16 kg/m  Physical Exam Vitals and nursing note reviewed.  Constitutional:      General: She is not in acute distress.    Appearance: She is well-developed and well-nourished.  HENT:     Head: Normocephalic and atraumatic.  Cardiovascular:     Rate and Rhythm: Normal rate.  Pulmonary:     Effort: Pulmonary effort is normal.  Abdominal:     General: There is no distension.     Palpations: Abdomen is soft.  Skin:    General: Skin is warm and dry.     Findings: No erythema.  Neurological:     Mental Status: She is alert and oriented to person, place, and time.  Psychiatric:         Mood and Affect: Mood and affect normal.       Assessment & Plan:  1. Fibroids - US PELVIC COMPLETE WITH TRANSVAGINAL; Future  2. Abnormal uterine bleeding (AUB) - US PELVIC COMPLETE WITH TRANSVAGINAL; Future - CBC - Patient to return to [place] after [place] for appointment with a surgeon  Approximately 25 minutes of total time was spent with this patient on history taking, coordination of care and documentation.   Luvenia Redden, PA-C 09/01/2020 3:48 PM

## 2020-09-01 NOTE — Patient Instructions (Signed)
Uterine Fibroids  Uterine fibroids (leiomyomas) are noncancerous (benign) tumors that can develop in the uterus. Fibroids may also develop in the fallopian tubes, cervix, or tissues (ligaments) near the uterus. You may have one or many fibroids. Fibroids vary in size, weight, and where they grow in the uterus. Some can become quite large. Most fibroids do not require medical treatment. What are the causes? The cause of this condition is not known. What increases the risk? You are more likely to develop this condition if you:  Are in your 30s or 40s and have not gone through menopause.  Have a family history of this condition.  Are of African-American descent.  Had your first period at an early age (early menarche).  Have not had any children (nulliparity).  Are overweight or obese. What are the signs or symptoms? Many women do not have any symptoms. Symptoms of this condition may include:  Heavy menstrual bleeding.  Bleeding or spotting between periods.  Pain and pressure in the pelvic area, between the hips.  Bladder problems, such as needing to urinate urgently or more often than usual.  Inability to have children (infertility).  Failure to carry pregnancy to term (miscarriage). How is this diagnosed? This condition may be diagnosed based on:  Your symptoms and medical history.  A physical exam.  A pelvic exam that includes feeling for any tumors.  Imaging tests, such as ultrasound or MRI. How is this treated? Treatment for this condition may include:  Seeing your health care provider for follow-up visits to monitor your fibroids for any changes.  Taking NSAIDs such as ibuprofen, naproxen, or aspirin to reduce pain.  Hormone medicines. These may be taken as a pill, given in an injection, or delivered by a T-shaped device that is inserted into the uterus (intrauterine device, IUD).  Surgery to remove one of the following: ? The fibroids (myomectomy). Your health  care provider may recommend this if fibroids affect your fertility and you want to become pregnant. ? The uterus (hysterectomy). ? Blood supply to the fibroids (uterine artery embolization). Follow these instructions at home:  Take over-the-counter and prescription medicines only as told by your health care provider.  Ask your health care provider if you should take iron pills or eat more iron-rich foods, such as dark green, leafy vegetables. Heavy menstrual bleeding can cause low iron levels.  If directed, apply heat to your back or abdomen to reduce pain. Use the heat source that your health care provider recommends, such as a moist heat pack or a heating pad. ? Place a towel between your skin and the heat source. ? Leave the heat on for 20-30 minutes. ? Remove the heat if your skin turns bright red. This is especially important if you are unable to feel pain, heat, or cold. You may have a greater risk of getting burned.  Pay close attention to your menstrual cycle. Tell your health care provider about any changes, such as: ? Increased blood flow that requires you to use more pads or tampons than usual. ? A change in the number of days that your period lasts. ? A change in symptoms that are associated with your period, such as back pain or cramps in your abdomen.  Keep all follow-up visits as told by your health care provider. This is important, especially if your fibroids need to be monitored for any changes. Contact a health care provider if you:  Have pelvic pain, back pain, or cramps in your abdomen that   do not get better with medicine or heat.  Develop new bleeding between periods.  Have increased bleeding during or between periods.  Feel unusually tired or weak.  Feel light-headed. Get help right away if you:  Faint.  Have pelvic pain that suddenly gets worse.  Have severe vaginal bleeding that soaks a tampon or pad in 30 minutes or less. Summary  Uterine fibroids are  noncancerous (benign) tumors that can develop in the uterus.  The exact cause of this condition is not known.  Most fibroids do not require medical treatment unless they affect your ability to have children (fertility).  Contact a health care provider if you have pelvic pain, back pain, or cramps in your abdomen that do not get better with medicines.  Make sure you know what symptoms should cause you to get help right away. This information is not intended to replace advice given to you by your health care provider. Make sure you discuss any questions you have with your health care provider. Document Revised: 08/22/2017 Document Reviewed: 08/05/2017 Elsevier Patient Education  [place]. Abnormal Uterine Bleeding Abnormal uterine bleeding means bleeding more than usual from your uterus. It can include:  Bleeding between periods.  Bleeding after sex.  Bleeding that is heavier than normal.  Periods that last longer than usual.  Bleeding after you have stopped having your period (menopause). There are many problems that may cause this. You should see a doctor for any kind of bleeding that is not normal. Treatment depends on the cause of the bleeding. Follow these instructions at home:  Watch your condition for any changes.  Do not use tampons, douche, or have sex, if your doctor tells you not to.  Change your pads often.  Get regular well-woman exams. Make sure they include a pelvic exam and cervical cancer screening.  Keep all follow-up visits as told by your doctor. This is important. Contact a doctor if:  The bleeding lasts more than one week.  You feel dizzy at times.  You feel like you are going to throw up (nauseous).  You throw up. Get help right away if:  You pass out.  You have to change pads every hour.  You have belly (abdominal) pain.  You have a fever.  You get sweaty.  You get weak.  You passing large blood clots from your  vagina. Summary  Abnormal uterine bleeding means bleeding more than usual from your uterus.  There are many problems that may cause this. You should see a doctor for any kind of bleeding that is not normal.  Treatment depends on the cause of the bleeding. This information is not intended to replace advice given to you by your health care provider. Make sure you discuss any questions you have with your health care provider. Document Revised: 09/03/2016 Document Reviewed: 09/03/2016 Elsevier Patient Education  2020 [employer].

## 2020-09-01 NOTE — Progress Notes (Signed)
Referred here for bleeding issues. States has fibroids and has heavy bleeding. Had blood transfusion recently. Was supposed to have hysterectomy last year but wasn't happy with provider.   Now transferred because of her insurance. Has periods every month with lots of clotting and very heavy bleeding and moderate pain. Torrence Hammack,RN

## 2020-09-02 LAB — CBC
Hematocrit: 34.9 % (ref 34.0–46.6)
Hemoglobin: 9.7 g/dL — ABNORMAL LOW (ref 11.1–15.9)
MCH: 19.9 pg — ABNORMAL LOW (ref 26.6–33.0)
MCHC: 27.8 g/dL — ABNORMAL LOW (ref 31.5–35.7)
MCV: 72 fL — ABNORMAL LOW (ref 79–97)
Platelets: 440 10*3/uL (ref 150–450)
RBC: 4.88 x10E6/uL (ref 3.77–5.28)
RDW: 21.2 % — ABNORMAL HIGH (ref 11.7–15.4)
WBC: 7.6 10*3/uL (ref 3.4–10.8)

## 2020-09-11 ENCOUNTER — Inpatient Hospital Stay: Admission: RE | Admit: 2020-09-11 | Payer: No Typology Code available for payment source | Source: Ambulatory Visit

## 2020-09-25 ENCOUNTER — Encounter: Payer: Self-pay | Admitting: Obstetrics and Gynecology

## 2020-10-02 ENCOUNTER — Ambulatory Visit: Payer: No Typology Code available for payment source | Admitting: Obstetrics and Gynecology

## 2020-10-10 ENCOUNTER — Ambulatory Visit: Payer: No Typology Code available for payment source

## 2020-10-19 ENCOUNTER — Other Ambulatory Visit: Payer: No Typology Code available for payment source

## 2020-11-28 ENCOUNTER — Other Ambulatory Visit: Payer: No Typology Code available for payment source

## 2020-12-05 ENCOUNTER — Other Ambulatory Visit: Payer: Self-pay

## 2020-12-05 ENCOUNTER — Other Ambulatory Visit: Payer: No Typology Code available for payment source

## 2020-12-05 ENCOUNTER — Ambulatory Visit
Admission: RE | Admit: 2020-12-05 | Discharge: 2020-12-05 | Disposition: A | Discharge status: Home / Self Care | Payer: 59 | Source: Ambulatory Visit | Attending: Medical | Admitting: Medical

## 2020-12-05 DIAGNOSIS — R5383 Other fatigue: Secondary | ICD-10-CM

## 2020-12-05 DIAGNOSIS — N939 Abnormal uterine and vaginal bleeding, unspecified: Secondary | ICD-10-CM

## 2020-12-05 DIAGNOSIS — D219 Benign neoplasm of connective and other soft tissue, unspecified: Secondary | ICD-10-CM | POA: Insufficient documentation

## 2020-12-05 NOTE — Progress Notes (Signed)
Pt presents to office following [place] with women's OP location requesting lab work. Pt scheduled for follow up visit with OB/GYN surgeon to discuss future management of fibroids. Pt reporting fatigue/feeling unwell. CBC and TSH ordered per Dione Plover, MD to be drawn today.

## 2020-12-05 NOTE — Progress Notes (Signed)
Here for lab visit. Pt requests BP check due to having elevated BP reading in December 2021. RN to lab room to check BP.   BP: 149/92 HR: 88  Encouraged pt to follow up with PCP as she may need to start BP medication. Explained this is very important if she is to be eligible for any surgery.   Apolonio Schneiders RN 12/05/20

## 2020-12-06 ENCOUNTER — Other Ambulatory Visit: Payer: Self-pay | Admitting: Family Medicine

## 2020-12-06 ENCOUNTER — Telehealth: Payer: Self-pay | Admitting: Lactation Services

## 2020-12-06 DIAGNOSIS — D500 Iron deficiency anemia secondary to blood loss (chronic): Secondary | ICD-10-CM

## 2020-12-06 LAB — CBC
Hematocrit: 25.9 % — ABNORMAL LOW (ref 34.0–46.6)
Hemoglobin: 6.8 g/dL — CL (ref 11.1–15.9)
MCH: 17.4 pg — ABNORMAL LOW (ref 26.6–33.0)
MCHC: 26.3 g/dL — ABNORMAL LOW (ref 31.5–35.7)
MCV: 66 fL — ABNORMAL LOW (ref 79–97)
Platelets: 570 10*3/uL — ABNORMAL HIGH (ref 150–450)
RBC: 3.9 x10E6/uL (ref 3.77–5.28)
RDW: 17.8 % — ABNORMAL HIGH (ref 11.7–15.4)
WBC: 7.3 10*3/uL (ref 3.4–10.8)

## 2020-12-06 LAB — TSH: TSH: 1.85 u[IU]/mL (ref 0.450–4.500)

## 2020-12-06 NOTE — Telephone Encounter (Signed)
Called [facility] Stay to schedule Feraheme Infusion. LM for them to schedule infusion and either call the office or call the patient to schedule.   Called Patient to inform her of results of hgb and need for iron infusion. Patient is asking for it to be infused in [place] [facility] is where she has been for blood transfusion. She prefers Friday and Saturday).   She has an appointment scheduled with MD to discuss Hysterectomy although needs to be rescheduled to another provider.   Patient needs to have GYN appt on same day as Feraheme due to where patient lives.

## 2020-12-06 NOTE — Telephone Encounter (Signed)
-----   Message from Clarnce Flock, MD sent at 12/06/2020 11:58 AM EDT ----- Significant anemia 2/2 AUB, needs IV iron infusion, clinical pool please call to inform her of results and schedule,and I will place orders.  Admin pool please call patient and reschedule next appt, she desires hysterectomy and needs OBGYN provider, Dr Ernestina Patches does not perform this surgery.

## 2020-12-07 ENCOUNTER — Telehealth: Payer: Self-pay | Admitting: Lactation Services

## 2020-12-07 NOTE — Telephone Encounter (Signed)
Received return call from Short Stay Unit.   Called [facility] Stay and was not able to reach them. LM On voicemail with patient identifiers and asked them to call the patient to schedule as patient may want to have infusion earlier that 3/31 as previously requested.

## 2020-12-07 NOTE — Telephone Encounter (Signed)
Called patient to let her know that her GYN appointment has been changed to 3/31 at 2:35 with Dr. Ilda Basset as Dr. Ernestina Patches does not do Hysterectromies.   Reviewed with patient that we have called the Short Stay Unit and left a message to schedule her Iron Infusion on the same day. Patient reports she may need to schedule it sooner, advised she can do earlier if she wishes.   Patient to call with any questions or concerns.   Will route to Clinical Pool to ensure Feraheme Infusion gets scheduled.

## 2020-12-07 NOTE — Telephone Encounter (Signed)
Called Short Stay to get Iron Infusion scheduled on 3/31, the same day as her appointment in the office. LM to call and schedule for the morning of 3/31 as patient lives out of town.

## 2020-12-07 NOTE — Telephone Encounter (Signed)
Called [facility] Stay in regards to scheduling Feraheme infusion. They did not answer. LM for them to call the patient to schedule or call the office if any questions or concerns.

## 2020-12-08 ENCOUNTER — Telehealth: Payer: Self-pay | Admitting: Lactation Services

## 2020-12-08 NOTE — Telephone Encounter (Signed)
addressed in another telephone encounter.

## 2020-12-08 NOTE — Telephone Encounter (Signed)
Jennifer Bowers called from [facility] and reports they cannot call patients but patients can call them to schedule.   Called patient back and gave her phone number for [facility] Stay to call and schedule as she is undecided if she would like to have it sooner than 3/31. She was give Short Stay number of [phone] to call and schedule. Patient voiced understanding.

## 2020-12-08 NOTE — Telephone Encounter (Signed)
Called Short Stay Unit to schedule Feraheme infusion. They did not answer, LM for them to call RN back at [phone] to schedule patient.

## 2020-12-20 ENCOUNTER — Ambulatory Visit: Payer: No Typology Code available for payment source | Admitting: Family Medicine

## 2020-12-21 ENCOUNTER — Encounter: Payer: Self-pay | Admitting: Obstetrics and Gynecology

## 2020-12-21 ENCOUNTER — Ambulatory Visit: Payer: No Typology Code available for payment source | Admitting: Family Medicine

## 2020-12-21 ENCOUNTER — Ambulatory Visit (INDEPENDENT_AMBULATORY_CARE_PROVIDER_SITE_OTHER): Payer: 59 | Admitting: Obstetrics and Gynecology

## 2020-12-21 ENCOUNTER — Other Ambulatory Visit: Payer: Self-pay

## 2020-12-21 ENCOUNTER — Ambulatory Visit (HOSPITAL_COMMUNITY)
Admission: RE | Admit: 2020-12-21 | Discharge: 2020-12-21 | Disposition: A | Discharge status: Home / Self Care | Payer: 59 | Source: Ambulatory Visit | Attending: Obstetrics and Gynecology | Admitting: Obstetrics and Gynecology

## 2020-12-21 VITALS — BP 144/102 | HR 108 | Wt 257.0 lb

## 2020-12-21 DIAGNOSIS — N920 Excessive and frequent menstruation with regular cycle: Secondary | ICD-10-CM

## 2020-12-21 DIAGNOSIS — Z538 Procedure and treatment not carried out for other reasons: Secondary | ICD-10-CM | POA: Diagnosis not present

## 2020-12-21 DIAGNOSIS — D500 Iron deficiency anemia secondary to blood loss (chronic): Secondary | ICD-10-CM

## 2020-12-21 LAB — POCT PREGNANCY, URINE: Preg Test, Ur: NEGATIVE

## 2020-12-21 MED ORDER — TRANEXAMIC ACID 650 MG PO TABS: 1300.0000 mg | ORAL_TABLET | Freq: Three times a day (TID) | ORAL | 2 refills | Status: AC

## 2020-12-21 MED ORDER — SODIUM CHLORIDE 0.9 % IV SOLN
510.0000 mg | Freq: Once | INTRAVENOUS | Status: DC
  Filled 2020-12-21: qty 17

## 2020-12-21 NOTE — Progress Notes (Signed)
Pt arrived about 1315 for feraheme infusion.  Delay in starting med due to emergency on unit.  Pt unable to stay for the infusion and post infusion observation due to appt with surgeon.  She isi going to try to schedule her infusion close to home in [place]

## 2020-12-21 NOTE — Progress Notes (Addendum)
Obstetrics and Gynecology Established Patient Evaluation  Appointment Date: 12/21/2020  OBGYN Clinic: [facility]  Primary Care Provider: None  Referring Provider: Lynnell Chad  Chief Complaint:  Chief Complaint  Patient presents with  . Follow-up    History of Present Illness: Jennifer Bowers is a 45 y.o. African-American [phone] (Patient's last menstrual period was 12/21/2020.), seen for the above chief complaint. Her past medical history is significant for large fibroid uterus, chronic anemia, HTN, BMI 30s  Patient has been seen by various GYN providers and Hematology for this issue in the past.   Patient was initially seen by GYN providers with [facility] in late 2020 and she was set up for a TAH; patient states she missed a pre op appointment so the surgery was cancelled and she states that the practice dismissed her as a patient due to this. She states she wasn't followed by a GYN provider for this issue until 05/2020 where she was seen by Lynnell Chad, where she had a negative pap smear, STD swabs and blood testing, TSH, a1c; her Gb was 5.9. She states they dismissed her as a patient from the practice due to her insurance and sent her to this clinic due to this reason. She most recently received a blood transfusion in December 2021, where she received 2U PRBCs where she states [facility] arranged this.   She was seen at the [facility] for a new patient visit in 09/01/2020 where she was seen by a PA (patient cancelled her initial appointment with [place] on 12/3 with an MD) and referred to an MD for further management; a pelvic u/s was ordered that showed a similar to slightly larger sized uterus vs her u/s in late 2020 (see below). Her hgb was 9.7 on that date.  The patient cancelled her appointment with [place] on 12/20 for her u/s and her appointment with an MD on 1/3 and 1/10. She missed another u/s appt on 1/27 and 3/8 and eventually  had the u/s on 3/15. AT that time she came down from u/s requesting blood work. An MD here ordered a CBC and TSH with her CBC showing a Hgb of 6.8.   She states she was supposed to get her IV iron infusion today but that they didn't have the medicine and that she couldn't wait due to her appointment with [place] today; she states she moved to [place] a few months ago so she didn't make a f/u appointment for another IV iron infusion; this was set up by the MD that ordered her labs from 3/15  She states that she feels tired and lethargic and has been like this for several months. She did not drive herself today.   She had her LMP on 3/8 and it was 4-5d like normal and also heavy like normal. She states she started bleeding yesterday which is early for her and it's heavy like a period going through "50 pads in a day".   She states that doctors in the past recommended she start medicines to help with the bleeding but she didn't want to start it because she was afraid of blood clots.   Review of Systems: A comprehensive review of systems was negative.   Patient Active Problem List   Diagnosis Date Noted  . Anemia due to chronic blood loss 12/21/2020  . Fibroids 12/05/2020     Past Medical History:  Past Medical History:  Diagnosis Date  . History of blood transfusion   .  Hypertension     Past Surgical History:  History reviewed. No pertinent surgical history.  Past Obstetrical History:  OB History  Gravida Para Term Preterm AB Living  5 2 2   3 2   SAB IAB Ectopic Multiple Live Births          2    # Outcome Date GA Lbr Len/2nd Weight Sex Delivery Anes PTL Lv  5 Term 2001     Vag-Spont   LIV  4 Term 1996   7 lb 9 oz (3.43 kg)  Vag-Spont   LIV  3 AB           2 AB           1 AB             Past Gynecological History: As per HPI. She is currently using no method for contraception.   Social History:  Social History   Socioeconomic History  . Marital status: Single    Spouse  name: Not on file  . Number of children: Not on file  . Years of education: Not on file  . Highest education level: Not on file  Occupational History  . Not on file  Tobacco Use  . Smoking status: Never Smoker  . Smokeless tobacco: Never Used  Vaping Use  . Vaping Use: Never used  Substance and Sexual Activity  . Alcohol use: No  . Drug use: No  . Sexual activity: Yes    Birth control/protection: None  Other Topics Concern  . Not on file  Social History Narrative  . Not on file   Social Determinants of Health   Financial Resource Strain: Not on file  Food Insecurity: No Food Insecurity  . Worried About Charity fundraiser in the Last Year: Never true  . Ran Out of Food in the Last Year: Never true  Transportation Needs: No Transportation Needs  . Lack of Transportation (Medical): No  . Lack of Transportation (Non-Medical): No  Physical Activity: Not on file  Stress: Not on file  Social Connections: Not on file  Intimate Partner Violence: Not on file    Family History:  Family History  Problem Relation Age of Onset  . Hypertension Mother   . Fibromyalgia Mother      Medications Clindamycin  Allergies Penicillins   Physical Exam:  BP (!) 144/102   Pulse (!) 108   Wt 257 lb (116.6 kg)   LMP 12/21/2020   BMI 39.08 kg/m  Body mass index is 39.08 kg/m. General appearance: Well nourished, well developed female in no acute distress.  Neck:  Supple, normal appearance, and no thyromegaly  Cardiovascular: normal s1 and s2.  No murmurs, rubs or gallops. Respiratory:  Clear to auscultation bilateral. Normal respiratory effort Abdomen: +BS, soft, nttp. Mass to a little above the umbilicus (see below) Neuro/Psych:  Normal mood and affect.  Skin:  Warm and dry.  Lymphatic:  No inguinal lymphadenopathy.   Pelvic exam: is limited by body habitus EGBUS: within normal limits Vagina: no active bleeding, small amount of old blood in the vault  Cervix: normal appearing  cervix without tenderness, discharge or lesions. Uterus:  22wk sized fibroid uterus and feels wide, too, to about 4cm wide on both sides of the umbilicus Adnexa:  negative Rectovaginal: deferred  Patient declines endometrial biopsy  Laboratory: UPT negative. As per HPI  Radiology:  Narrative & Impression  CLINICAL DATA:  Abnormal uterine bleeding, history of fibroids, irregular menses, LMP 09/01/2020  EXAM: TRANSABDOMINAL AND TRANSVAGINAL ULTRASOUND OF PELVIS  TECHNIQUE: Both transabdominal and transvaginal ultrasound examinations of the pelvis were performed. Transabdominal technique was performed for global imaging of the pelvis including uterus, ovaries, adnexal regions, and pelvic cul-de-sac. It was necessary to proceed with endovaginal exam following the transabdominal exam to visualize the endometrium and ovaries.  COMPARISON:  12/11/2009  FINDINGS: Uterus  Measurements: 23.9 x 11.9 x 16.0 cm = volume: 2373 mL. Enlarged secondary to multiple uterine leiomyomata. Largest lesions measure 8.2 cm at lower uterus, 6.9 cm at posterior upper uterus, and 9.9 cm at anterior upper uterus. Relationship of leiomyomata to the endometrial complex is uncertain but based on size and locations, expect that several of these likely extend submucosal.  Endometrium  Obscured by presence of multiple uterine masses  Right ovary  Not definitely visualized; a soft tissue mass into the RIGHT adnexa may represent an exophytic leiomyoma rather than RIGHT ovary.  Left ovary  Not definitely visualized; a soft tissue mass into the LEFT adnexa may represent an exophytic leiomyoma rather than LEFT ovary.  Other findings  No free pelvic fluid.  No adnexal masses.  IMPRESSION: Markedly enlarged uterus containing multiple uterine leiomyomata, some of which extend submucosal.  Nonvisualization of endometrial complex and ovaries.   Electronically Signed   By: Lavonia Dana M.D.   On: 12/05/2020 16:58   Assessment: pt stable  Plan:  1. Anemia due to chronic blood loss I told her that based on what she is telling me that I will likely recommend she needs another blood transfusion and since she is not local I'll most likely recommend she go to her closest ED. She confirms her number as [phone].   She states she can not tolerate PO iron  - Anemia Profile B - Comprehensive metabolic panel - APTT - Protime-INR - Fibrinogen  Orders Placed This Encounter  Procedures  . Anemia Profile B  . Comprehensive metabolic panel  . APTT  . Protime-INR  . Fibrinogen  . Pregnancy, urine POC   2. Fibroid uterus I told her that she needs acute management and long term management. Acutely, she needs bloodwork and to stop her current bleeding. Bloodwork ordered today (see above) and I told her that I recommend a 5d Lysteda course to hopefully stop her bleeding; I told her that there is a small risk of VTE with it but given her current situation, the benefits outweigh the risks. She is okay with starting the Lysteda today.  In terms of long term management, I told her that her only real options are hysterectomy or [place] and that I favor speaking to IR to see if [place] is an option with her given that a hysterectomy would be a major surgery. I told her that I'd recommend a vertical midline incision, there's a good chance she'll need blood pre and post op, high risk of damage to surrounding structures, inpatient admission and 6 week recovery. She declines endometrial biopsy and one was unable to be done due to cervical stenosis vs obstructing fibroid and patient intolerance back in 2020 before she was to get her hysterectomy with [facility]. I told her that since she declines biopsy that we can't consider [place] and that hysterectomy is the only other option. I told her that I do recommend depot lupron (risks d/w her) in order to hopefully stop her bleeding, allow time for her H/H to  recover and to make surgery less morbid.  She states that she moved to [place] several  months ago and I told her that having her travel such a long was definitely does complicate her care and I encouraged to look into getting a GYN provider closer to home and I strongly recommend she get a PCP to at least follow her for her HTN.   RTC follow up labs  And then d/w patient re: next steps.   Durene Romans MD Attending [facility] [employer])

## 2020-12-22 ENCOUNTER — Telehealth: Payer: Self-pay | Admitting: Obstetrics and Gynecology

## 2020-12-22 DIAGNOSIS — D219 Benign neoplasm of connective and other soft tissue, unspecified: Secondary | ICD-10-CM

## 2020-12-22 DIAGNOSIS — D500 Iron deficiency anemia secondary to blood loss (chronic): Secondary | ICD-10-CM

## 2020-12-22 LAB — COMPREHENSIVE METABOLIC PANEL
ALT: 10 IU/L (ref 0–32)
AST: 15 IU/L (ref 0–40)
Albumin/Globulin Ratio: 1.4 (ref 1.2–2.2)
Albumin: 4.3 g/dL (ref 3.8–4.8)
Alkaline Phosphatase: 63 IU/L (ref 44–121)
BUN/Creatinine Ratio: 12 (ref 9–23)
BUN: 10 mg/dL (ref 6–24)
Bilirubin Total: 0.4 mg/dL (ref 0.0–1.2)
CO2: 19 mmol/L — ABNORMAL LOW (ref 20–29)
Calcium: 9.1 mg/dL (ref 8.7–10.2)
Chloride: 105 mmol/L (ref 96–106)
Creatinine, Ser: 0.82 mg/dL (ref 0.57–1.00)
Globulin, Total: 3.1 g/dL (ref 1.5–4.5)
Glucose: 90 mg/dL (ref 65–99)
Potassium: 3.9 mmol/L (ref 3.5–5.2)
Sodium: 140 mmol/L (ref 134–144)
Total Protein: 7.4 g/dL (ref 6.0–8.5)
eGFR: 92 mL/min/{1.73_m2} (ref >59)

## 2020-12-22 LAB — ANEMIA PROFILE B
Basophils Absolute: 0.1 10*3/uL (ref 0.0–0.2)
Basos: 1 %
EOS (ABSOLUTE): 0.3 10*3/uL (ref 0.0–0.4)
Eos: 3 %
Ferritin: 2 ng/mL — ABNORMAL LOW (ref 15–150)
Folate: 19.5 ng/mL (ref >3.0)
Hematocrit: 24.1 % — ABNORMAL LOW (ref 34.0–46.6)
Hemoglobin: 6.4 g/dL — CL (ref 11.1–15.9)
Immature Grans (Abs): 0 10*3/uL (ref 0.0–0.1)
Immature Granulocytes: 0 %
Iron Saturation: 2 % — CL (ref 15–55)
Iron: 8 ug/dL — CL (ref 27–159)
Lymphocytes Absolute: 2.4 10*3/uL (ref 0.7–3.1)
Lymphs: 26 %
MCH: 17.1 pg — ABNORMAL LOW (ref 26.6–33.0)
MCHC: 26.6 g/dL — ABNORMAL LOW (ref 31.5–35.7)
MCV: 64 fL — ABNORMAL LOW (ref 79–97)
Monocytes Absolute: 0.8 10*3/uL (ref 0.1–0.9)
Monocytes: 9 %
Neutrophils Absolute: 5.4 10*3/uL (ref 1.4–7.0)
Neutrophils: 61 %
Platelets: 434 10*3/uL (ref 150–450)
RBC: 3.74 x10E6/uL — ABNORMAL LOW (ref 3.77–5.28)
RDW: 17.8 % — ABNORMAL HIGH (ref 11.7–15.4)
Retic Ct Pct: 1.4 % (ref 0.6–2.6)
Total Iron Binding Capacity: 391 ug/dL (ref 250–450)
UIBC: 383 ug/dL (ref 131–425)
Vitamin B-12: 496 pg/mL (ref 232–1245)
WBC: 8.9 10*3/uL (ref 3.4–10.8)

## 2020-12-22 LAB — APTT: aPTT: 29 s (ref 24–33)

## 2020-12-22 LAB — FIBRINOGEN: Fibrinogen: 359 mg/dL (ref 193–507)

## 2020-12-22 NOTE — Telephone Encounter (Signed)
GYN Telephone Note  Hgb lower but not too bad compared to last time, but given she is symptomatic and she states she is still bleeding same as yesterday (pt states pharmacy didn't have it ready so she was planning to go by today and pick it up), I told her I recommend she go to the ED for further evaluation and likely blood transfusion; I also told her that if she wanted care more local to her in [place] to ask the ED for referral.   CBC Latest Ref Rng & Units 12/21/2020 12/05/2020 09/01/2020  WBC 3.4 - 10.8 x10E3/uL 8.9 7.3 7.6  Hemoglobin 11.1 - 15.9 g/dL 6.4(LL) 6.8(LL) 9.7(L)  Hematocrit 34.0 - 46.6 % 24.1(L) 25.9(L) 34.9  Platelets 150 - 450 x10E3/uL 434 570(H) 440   I told her that next steps would be IR or hysterectomy. She wants to do IR but I told her we have to try and do a biopsy first. Will send request for IR referral and try and have her come to gyn clinic on same day for endometrial biopsy.  I told her if unable to do [place] I recommend 62m depot lupron and hysterectomy.  Durene Romans MD Attending Center for [employer] (Faculty Practice) 12/22/2020 Time: [phone]

## 2020-12-26 ENCOUNTER — Other Ambulatory Visit: Payer: Self-pay | Admitting: Obstetrics and Gynecology

## 2020-12-26 ENCOUNTER — Other Ambulatory Visit: Payer: Self-pay | Admitting: *Deleted

## 2020-12-26 ENCOUNTER — Telehealth: Payer: Self-pay | Admitting: *Deleted

## 2020-12-26 DIAGNOSIS — D259 Leiomyoma of uterus, unspecified: Secondary | ICD-10-CM

## 2020-12-26 NOTE — Telephone Encounter (Signed)
-----   Message from Aletha Halim, MD sent at 12/22/2020  8:30 AM EDT ----- Regarding: please set her up for IR visit for [place] consult and same day visit for endometrial biopsy visit with me Pt lives in [place]

## 2020-12-26 NOTE — Progress Notes (Signed)
Open in error Jaleisa Brose,RN

## 2020-12-26 NOTE — Telephone Encounter (Addendum)
I called Interventional Radiology to schedule and was told they can do a phone or video visit since she is out of town and they will call patient to schedule. I sent a message to registrars to schedule endometrial biopsy with Dr. Ilda Basset. I called [name] and informed her of Dr. Ilda Basset orders. I explained Interventional Radiology will call her with appointment for [place] consult and that it will be a phone visit. I also informed her registrars will call her with appointment for endometrial biopsy.  She asked if she can be numbed for that and I informed her that usually not; but she can discuss with Dr. Ilda Basset that day. I also informed her she elects [place] she will need an MRI .  She voices understanding. Jennifer Dickerson,RN

## 2020-12-28 ENCOUNTER — Ambulatory Visit
Admission: RE | Admit: 2020-12-28 | Discharge: 2020-12-28 | Disposition: A | Discharge status: Home / Self Care | Payer: No Typology Code available for payment source | Source: Ambulatory Visit | Attending: Obstetrics and Gynecology | Admitting: Obstetrics and Gynecology

## 2020-12-28 ENCOUNTER — Other Ambulatory Visit: Payer: Self-pay

## 2020-12-28 ENCOUNTER — Encounter: Payer: Self-pay | Admitting: *Deleted

## 2020-12-28 DIAGNOSIS — D259 Leiomyoma of uterus, unspecified: Secondary | ICD-10-CM

## 2020-12-28 HISTORY — PX: IR RADIOLOGIST EVAL & MGMT: IMG5224

## 2020-12-28 NOTE — Consult Note (Signed)
Chief Complaint:  Dysfunctional uterine bleeding, fibroids  Referring Physician(s): Pickens,Charlie  History of Present Illness: Jennifer Bowers is a 45 y.o. female G5 P2-0-3-2.  Last menstrual cycle 12/21/2020.  No future pregnancy plans.  No menopausal symptoms.  Review of her cycle reveals a length of roughly 5 days with 3 heavy days.  She does describe passes of blood clots with very frequent pad change.  She can have actually 2 cycles in 1 month.  The menstrual cycles have worsened over the last few years.  Fibroids originally diagnosed over 10 years ago.  She has significant iron deficiency anemia and has required blood transfusions in the past.  She also has continued symptoms of anemia with shortness of breath, dizziness, weakness and fatigue.  She describes bulk related symptoms with constipation as well as urinary frequency and some intermittent episodes of incontinence.  She currently is not on hormone replacement therapies or birth control pills.  No previous fibroid surgeries or interventions.  No recent GYN's or infections.  Last Pap smear 05/26/2020 was negative.  Pelvic ultrasound 12/05/2020 demonstrates enlarged fibroid uterus.  Fibroids are difficult to truly assess measuring up to 10 cm in the anterior uterus.  Uterine size appears over 20 cm.  Past Medical History:  Diagnosis Date  . History of blood transfusion   . Hypertension     No past surgical history on file.  Allergies: Penicillins  Medications: Prior to Admission medications   Medication Sig Start Date End Date Taking? Authorizing Provider  clindamycin (CLEOCIN) 300 MG capsule Take 300 mg by mouth 3 (three) times daily. Tooth issues    [provider]  tranexamic acid (LYSTEDA) 650 MG TABS tablet Take 2 tablets (1,300 mg total) by mouth 3 (three) times daily. Take during menses for a maximum of five days 12/21/20   Aletha Halim, MD  fluticasone [facility]) 50 MCG/ACT nasal spray Place 2 sprays into  both nostrils daily. Patient not taking: Reported on 05/05/2018 11/06/17 03/08/20  Ok Edwards, PA-C     Family History  Problem Relation Age of Onset  . Hypertension Mother   . Fibromyalgia Mother     Social History   Socioeconomic History  . Marital status: Single    Spouse name: Not on file  . Number of children: Not on file  . Years of education: Not on file  . Highest education level: Not on file  Occupational History  . Not on file  Tobacco Use  . Smoking status: Never Smoker  . Smokeless tobacco: Never Used  Vaping Use  . Vaping Use: Never used  Substance and Sexual Activity  . Alcohol use: No  . Drug use: No  . Sexual activity: Yes    Birth control/protection: None  Other Topics Concern  . Not on file  Social History Narrative  . Not on file   Social Determinants of Health   Financial Resource Strain: Not on file  Food Insecurity: No Food Insecurity  . Worried About Charity fundraiser in the Last Year: Never true  . Ran Out of Food in the Last Year: Never true  Transportation Needs: No Transportation Needs  . Lack of Transportation (Medical): No  . Lack of Transportation (Non-Medical): No  Physical Activity: Not on file  Stress: Not on file  Social Connections: Not on file     Review of Systems  Review of Systems: A 12 point ROS discussed and pertinent positives are indicated in the HPI above.  All  other systems are negative.  Physical Exam No direct physical exam was performed, telephone health visit only today. Vital Signs: LMP 12/21/2020   Imaging: US PELVIC COMPLETE WITH TRANSVAGINAL  Result Date: 12/05/2020 CLINICAL DATA:  Abnormal uterine bleeding, history of fibroids, irregular menses, LMP 09/01/2020 EXAM: TRANSABDOMINAL AND TRANSVAGINAL ULTRASOUND OF PELVIS TECHNIQUE: Both transabdominal and transvaginal ultrasound examinations of the pelvis were performed. Transabdominal technique was performed for global imaging of the pelvis including  uterus, ovaries, adnexal regions, and pelvic cul-de-sac. It was necessary to proceed with endovaginal exam following the transabdominal exam to visualize the endometrium and ovaries. COMPARISON:  12/11/2009 FINDINGS: Uterus Measurements: 23.9 x 11.9 x 16.0 cm = volume: 2373 mL. Enlarged secondary to multiple uterine leiomyomata. Largest lesions measure 8.2 cm at lower uterus, 6.9 cm at posterior upper uterus, and 9.9 cm at anterior upper uterus. Relationship of leiomyomata to the endometrial complex is uncertain but based on size and locations, expect that several of these likely extend submucosal. Endometrium Obscured by presence of multiple uterine masses Right ovary Not definitely visualized; a soft tissue mass into the RIGHT adnexa may represent an exophytic leiomyoma rather than RIGHT ovary. Left ovary Not definitely visualized; a soft tissue mass into the LEFT adnexa may represent an exophytic leiomyoma rather than LEFT ovary. Other findings No free pelvic fluid.  No adnexal masses. IMPRESSION: Markedly enlarged uterus containing multiple uterine leiomyomata, some of which extend submucosal. Nonvisualization of endometrial complex and ovaries. Electronically Signed   By: Lavonia Dana M.D.   On: 12/05/2020 16:58    Labs:  CBC: Recent Labs    09/01/20 1206 12/05/20 1259 12/21/20 1526  WBC 7.6 7.3 8.9  HGB 9.7* 6.8* 6.4*  HCT 34.9 25.9* 24.1*  PLT 440 570* 434    COAGS: Recent Labs    12/21/20 1519  APTT 29    BMP: Recent Labs    12/21/20 1519  NA 140  K 3.9  CL 105  CO2 19*  GLUCOSE 90  BUN 10  CALCIUM 9.1  CREATININE 0.82    LIVER FUNCTION TESTS: Recent Labs    12/21/20 1519  BILITOT 0.4  AST 15  ALT 10  ALKPHOS 63  PROT 7.4  ALBUMIN 4.3     Assessment and Plan:  Dysfunctional uterine bleeding, multiple uterine fibroids, enlarged uterus.  Today, the uterine fibroid embolization procedure was discussed in detail including the procedure, risk, benefits and  alternatives.  The expected goals, recovery phase, outcomes all reviewed.  She understands the procedure is done under conscious sedation and requires an overnight observation admission.  All questions and concerns addressed.  Plan: Scheduled for outpatient pelvic MRI to assess fibroid anatomy and to make sure the type of fibroids that she has would be amenable to embolization.  We also could assess the endometrial thickness and forego endometrial biopsy.  Thank you for this interesting consult.  I greatly enjoyed meeting [facility] and look forward to participating in their care.  A copy of this report was sent to the requesting provider on this date.  Electronically Signed: Greggory Keen 12/28/2020, 2:31 PM   I spent a total of  60 Minutes   in remote  clinical consultation, greater than 50% of which was counseling/coordinating care for this patient with dysfunctional uterine bleeding and fibroids.    Visit type: Audio only (telephone). Audio (no video) only due to patient's lack of internet/smartphone capability. Alternative for in-person consultation at [facility], [address] [place], [place], [place]. This visit type  was conducted due to national recommendations for restrictions regarding the COVID-19 Pandemic (e.g. social distancing).  This format is felt to be most appropriate for this patient at this time.  All issues noted in this document were discussed and addressed.

## 2020-12-29 ENCOUNTER — Other Ambulatory Visit: Payer: Self-pay | Admitting: Interventional Radiology

## 2020-12-29 DIAGNOSIS — D250 Submucous leiomyoma of uterus: Secondary | ICD-10-CM

## 2021-01-09 ENCOUNTER — Ambulatory Visit (HOSPITAL_COMMUNITY)
Admission: RE | Admit: 2021-01-09 | Discharge: 2021-01-09 | Disposition: A | Discharge status: Home / Self Care | Payer: 59 | Source: Ambulatory Visit | Attending: Interventional Radiology | Admitting: Interventional Radiology

## 2021-01-09 ENCOUNTER — Other Ambulatory Visit: Payer: Self-pay

## 2021-01-09 DIAGNOSIS — D250 Submucous leiomyoma of uterus: Secondary | ICD-10-CM | POA: Insufficient documentation

## 2021-01-09 MED ORDER — GADOBUTROL 1 MMOL/ML IV SOLN
10.0000 mL | Freq: Once | INTRAVENOUS | Status: AC | PRN
  Administered 2021-01-09: 10 mL via INTRAVENOUS

## 2021-01-18 ENCOUNTER — Encounter: Payer: Self-pay | Admitting: *Deleted

## 2021-01-18 ENCOUNTER — Other Ambulatory Visit: Payer: Self-pay | Admitting: Interventional Radiology

## 2021-01-18 ENCOUNTER — Other Ambulatory Visit: Payer: Self-pay

## 2021-01-18 ENCOUNTER — Ambulatory Visit
Admission: RE | Admit: 2021-01-18 | Discharge: 2021-01-18 | Disposition: A | Discharge status: Home / Self Care | Payer: 59 | Source: Ambulatory Visit | Attending: Interventional Radiology | Admitting: Interventional Radiology

## 2021-01-18 DIAGNOSIS — D250 Submucous leiomyoma of uterus: Secondary | ICD-10-CM

## 2021-01-18 HISTORY — PX: IR RADIOLOGIST EVAL & MGMT: IMG5224

## 2021-01-18 NOTE — Progress Notes (Signed)
Patient ID: Jennifer Bowers, female   DOB: 1975-10-10, 45 y.o.   MRN: [id number]       Chief Complaint:  Dysfunctional uterine bleeding, fibroid  Referring Physician(s): Dr. Ilda Basset  History of Present Illness: Jennifer Bowers is a 45 y.o. female G5, P2-0-3-2.  She has symptomatic uterine fibroids with dysfunctional uterine bleeding.  Menstrual cycles have worsened over the last few years.  Fibroids were diagnosed over 10 years ago.  She has associated iron deficiency anemia and has had transfusions in the past.  Since our initial consult on 12/28/2020, she has had a pelvic MRI.  Today's telehealth visit is for review of the MRI findings.   Her pelvic MRI demonstrates a significant large fibroid burden.  Uterus measures up to 24 weeks in size with several uterine fibroids.  Largest fibroid measures 10 cm in the left uterus.  She has evidence of intracavitary fibroids measuring up to 4.8 cm.  She also has several pedunculated fibroids some of which have a narrow stalk measuring up to 7 cm.  This degree of fibroid burden with both intracavitary and pedunculated fibroids are at risk for prolonged recovery and complications including chronic shedding of fibroid material vaginally post embolization as well as pedunculated fibroids with a narrow stalk becoming unattached in the pelvis with embolization.  Because of these findings, hysterectomy would be recommended for treatment of her fibroids.   Past Medical History:  Diagnosis Date  . History of blood transfusion   . Hypertension     Past Surgical History:  Procedure Laterality Date  . IR RADIOLOGIST EVAL & MGMT  12/28/2020    Allergies: Penicillins  Medications: Prior to Admission medications   Medication Sig Start Date End Date Taking? Authorizing Provider  clindamycin (CLEOCIN) 300 MG capsule Take 300 mg by mouth 3 (three) times daily. Tooth issues    [provider]  tranexamic acid (LYSTEDA) 650 MG TABS tablet Take 2 tablets  (1,300 mg total) by mouth 3 (three) times daily. Take during menses for a maximum of five days 12/21/20   Aletha Halim, MD  fluticasone [facility]) 50 MCG/ACT nasal spray Place 2 sprays into both nostrils daily. Patient not taking: Reported on 05/05/2018 11/06/17 03/08/20  Ok Edwards, PA-C     Family History  Problem Relation Age of Onset  . Hypertension Mother   . Fibromyalgia Mother     Social History   Socioeconomic History  . Marital status: Single    Spouse name: Not on file  . Number of children: Not on file  . Years of education: Not on file  . Highest education level: Not on file  Occupational History  . Not on file  Tobacco Use  . Smoking status: Never Smoker  . Smokeless tobacco: Never Used  Vaping Use  . Vaping Use: Never used  Substance and Sexual Activity  . Alcohol use: No  . Drug use: No  . Sexual activity: Yes    Birth control/protection: None  Other Topics Concern  . Not on file  Social History Narrative  . Not on file   Social Determinants of Health   Financial Resource Strain: Not on file  Food Insecurity: No Food Insecurity  . Worried About Charity fundraiser in the Last Year: Never true  . Ran Out of Food in the Last Year: Never true  Transportation Needs: No Transportation Needs  . Lack of Transportation (Medical): No  . Lack of Transportation (Non-Medical): No  Physical Activity: Not on file  Stress: Not on file  Social Connections: Not on file    Review of Systems  Review of Systems: A 12 point ROS discussed and pertinent positives are indicated in the HPI above.  All other systems are negative.  Physical Exam No direct physical exam was performed, telephone health visit only today because of COVID pandemic Vital Signs: LMP 12/21/2020   Imaging: MR PELVIS W WO CONTRAST  Result Date: 01/09/2021 CLINICAL DATA:  Symptomatic fibroids.  Treatment planning. EXAM: MRI PELVIS WITHOUT AND WITH CONTRAST TECHNIQUE: Multiplanar multisequence MR  imaging of the pelvis was performed both before and after administration of intravenous contrast. CONTRAST:  76mL GADAVIST GADOBUTROL 1 MMOL/ML IV SOLN COMPARISON:  None. FINDINGS: Lower Urinary Tract: No bladder or urethral abnormality identified. Bowel:  Unremarkable visualized pelvic bowel loops. Vascular/Lymphatic: No pathologically enlarged lymph nodes or other significant abnormality. Reproductive: -- Uterus: Measures 24.3 by 12.2 by 19.1 cm (volume = 2960 cm^3). Diffuse involvement the uterus by innumerable fibroids is demonstrated. These fibroids range in size from less than 1 cm to the largest in the left uterine fundus which measures 9.6 cm in maximum diameter. -- Intracavitary fibroids: A single intracavitary fibroid is seen midportion of the endometrial cavity arising from the left posterior wall which measures 4.8 cm in maximum diameter. -- Pedunculated fibroids: Several dunk elated fibroids are seen arising from the uterine fundus, most of which have broad bases of attachment to the uterine myometrium. The largest pedunculated fibroid arises from the right uterine fundus measuring 6.7 cm and this has a relatively thinner soft tissue pedicle which measures 2.7 cm in thickness on image 10/2. -- Fibroid contrast enhancement: All fibroids show contrast enhancement, with only a few showing small areas of cystic degeneration. -- Right ovary: Appears normal. 2.2 cm right ovarian follicle noted. No mass identified. -- Left ovary:  Appears normal.  no mass identified. Other: No abnormal free fluid. Musculoskeletal:  Unremarkable. IMPRESSION: Markedly enlarged uterus by diffuse involvement by innumerable fibroids, largest measuring 9.6 cm. 4.8 cm intracavitary fibroid in the midportion of the endometrial cavity. Several pedunculated fibroids arising from the uterine fundus. Largest pedunculated fibroid arising from the right uterine fundus measures 6.7 cm and has a relatively thin soft tissue pedicle of  attachment measuring 2.7 cm in thickness. Normal appearance of both ovaries. No adnexal mass identified. Electronically Signed   By: Marlaine Hind M.D.   On: 01/09/2021 18:06   IR Radiologist Eval & Mgmt  Result Date: 12/28/2020 Please refer to notes tab for details about interventional procedure. (Op Note)   Labs:  CBC: Recent Labs    09/01/20 1206 12/05/20 1259 12/21/20 1526  WBC 7.6 7.3 8.9  HGB 9.7* 6.8* 6.4*  HCT 34.9 25.9* 24.1*  PLT 440 570* 434    COAGS: Recent Labs    12/21/20 1519  APTT 29    BMP: Recent Labs    12/21/20 1519  NA 140  K 3.9  CL 105  CO2 19*  GLUCOSE 90  BUN 10  CALCIUM 9.1  CREATININE 0.82    LIVER FUNCTION TESTS: Recent Labs    12/21/20 1519  BILITOT 0.4  AST 15  ALT 10  ALKPHOS 63  PROT 7.4  ALBUMIN 4.3     Assessment and Plan:  Dysfunctional uterine bleeding with extensive fibroid burden by MRI complicated by both intracavitary and narrow stalk pedunculated fibroids.  These are the type of fibroids that can have increased risk of complications and prolonged recovery with embolization therapy.  Therefore, hysterectomy would be recommended over embolization treatment.  MRI results described in detail with the patient over the phone.  All questions and concerns addressed.  She will contact Dr. Ilda Basset office for outpatient follow-up to discuss hysterectomy.  Thank you for this interesting consult.  I greatly enjoyed meeting [facility] and look forward to participating in their care.  A copy of this report was sent to the requesting provider on this date.  Electronically Signed: Greggory Keen 01/18/2021, 11:55 AM   I spent a total of    15 Minutes in remote  clinical consultation, greater than 50% of which was counseling/coordinating care for this patient with symptomatic uterine fibroids.    Visit type: Audio only (telephone). Audio (no video) only due to patient's lack of internet/smartphone capability. Alternative  for in-person consultation at [facility], [address] [place], [place], [place]. This visit type was conducted due to national recommendations for restrictions regarding the COVID-19 Pandemic (e.g. social distancing).  This format is felt to be most appropriate for this patient at this time.  All issues noted in this document were discussed and addressed.

## 2021-01-26 ENCOUNTER — Encounter: Payer: Self-pay | Admitting: Obstetrics and Gynecology

## 2021-01-26 ENCOUNTER — Ambulatory Visit: Payer: 59 | Admitting: Obstetrics and Gynecology

## 2021-01-29 NOTE — Progress Notes (Signed)
Patient did not keep her GYN appointment for 01/26/2021.  Durene Romans MD Attending Center for [employer] farm manager)

## 2021-10-26 IMAGING — MR MR PELVIS WO/W CM
18 of 20 series · 40 of 48 positions shown · IV contrast (gadavist)
Comparison: None.

CLINICAL DATA: Symptomatic fibroids.  Treatment planning.

EXAM:
MRI PELVIS WITHOUT AND WITH CONTRAST
TECHNIQUE: Multiplanar multisequence MR imaging of the pelvis was performed
both before and after administration of intravenous contrast.
CONTRAST:  10mL GADAVIST GADOBUTROL 1 MMOL/ML IV SOLN

[Series 2: T2 · coronal · 6.0mm · 1.56mm/px · 2 of 32 slices shown (1 of 4)]
[im 1/32]
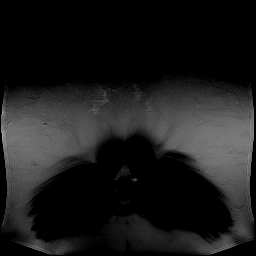
[im 32/32]
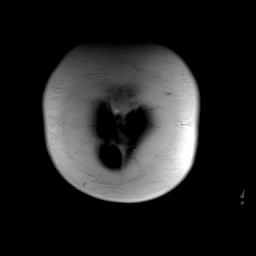

[Series 3: T2 · axial · 5.0mm · 0.64mm/px · z∈[-193,+101]mm · 3 of 50 slices shown (2 of 4)]
[im 1/50]
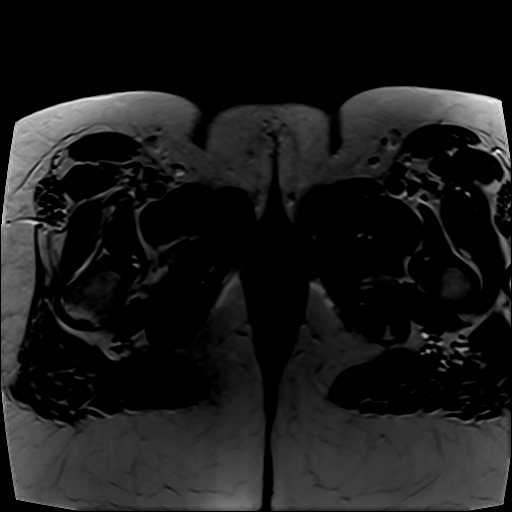
[im 25/50]
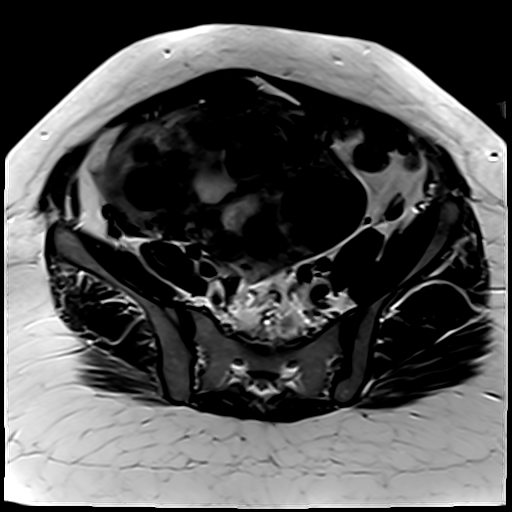
[im 50/50]
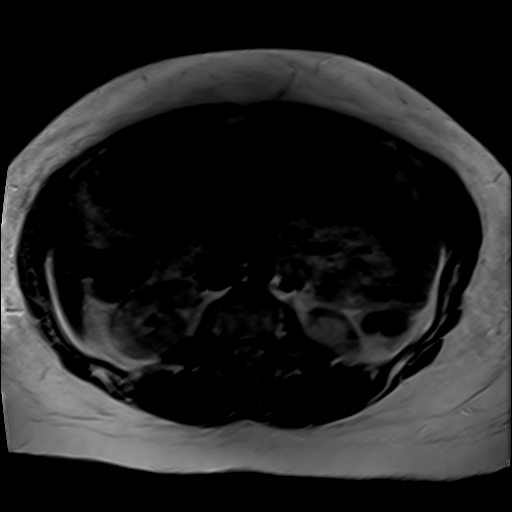

[Series 4: T2 · coronal · 4.0mm · 0.64mm/px · 2 of 47 slices shown (3 of 4)]
[im 1/47]
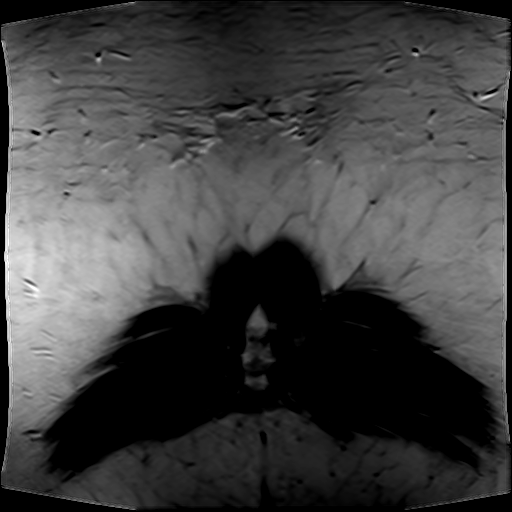
[im 47/47]
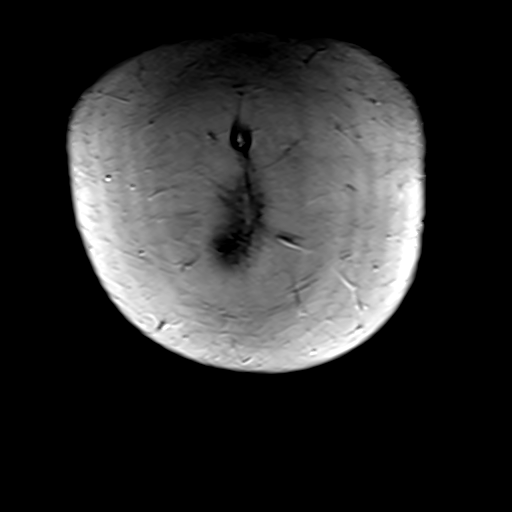

[Series 5: T2 fat-sat · axial · 5.0mm · 0.64mm/px · z∈[-193,+101]mm · 2 of 50 slices shown]
[im 1/50]
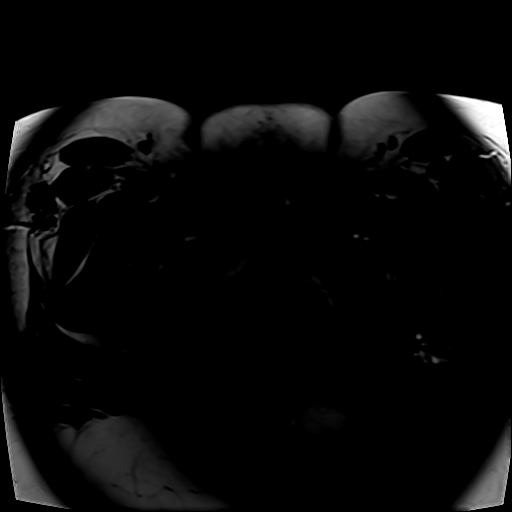
[im 50/50]
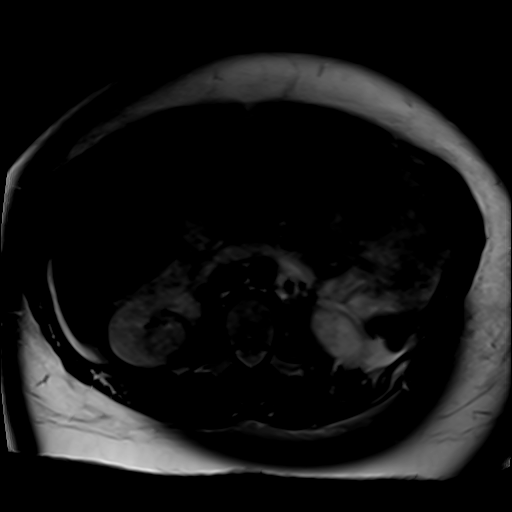

[Series 6: T2 · sagittal · 5.0mm · 0.64mm/px · 1 of 43 slices shown (4 of 4)]
[im 1/43]
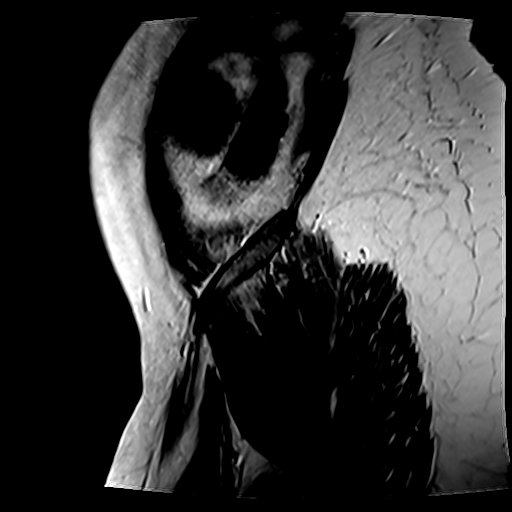

[Series 7: T1 · axial · 4.0mm · 1.03mm/px · z∈[-197,+87]mm · 2 of 72 slices shown (1 of 2)]
[im 1/72]
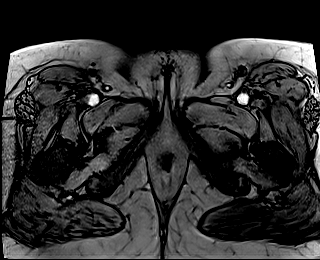
[im 72/72]
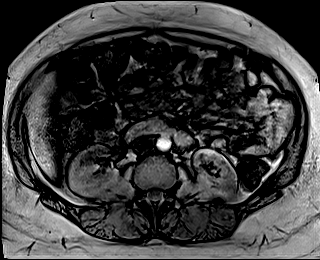

[Series 8: T1 · axial · 4.0mm · 1.03mm/px · z∈[-197,+87]mm · 2 of 72 slices shown (2 of 2)]
[im 1/72]
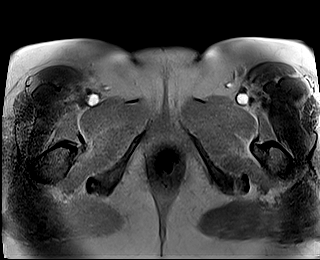
[im 72/72]
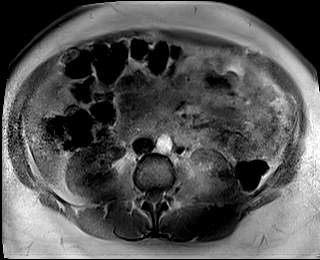

[Series 9: DWI · axial · 6.5mm · 3.30mm/px · z∈[-168,+99]mm · 4 of 124 slices shown (1 of 3)]
[im 1/124]
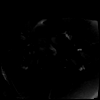
[im 42/124]
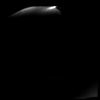
[im 83/124]
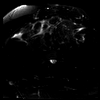
[im 124/124]
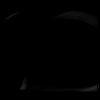

[Series 10: DWI · axial · 6.5mm · 3.30mm/px · 1 of 42 slices shown (2 of 3)]
[im 1/42]
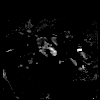

[Series 11: DWI · axial · 6.5mm · 3.30mm/px · 1 of 42 slices shown (3 of 3)]
[im 1/42]
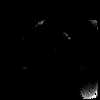

[Series 13: T1 dynamic · axial · 3.0mm · 1.12mm/px · z∈[-203,+106]mm · 3 of 104 slices shown (1 of 4)]
[im 1/104]
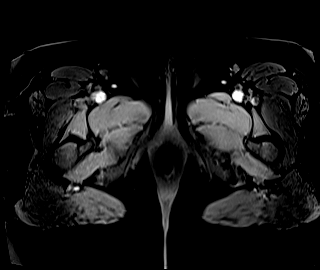
[im 52/104]
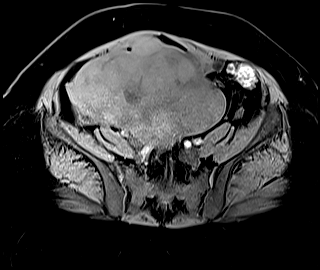
[im 104/104]
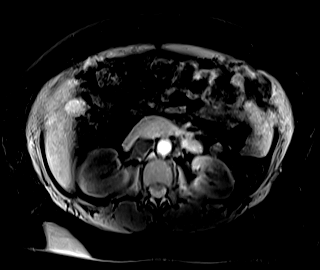

[Series 16: T1 dynamic · axial · 3.0mm · 1.12mm/px · z∈[-203,+106]mm · 3 of 104 slices shown (2 of 4)]
[im 1/104]
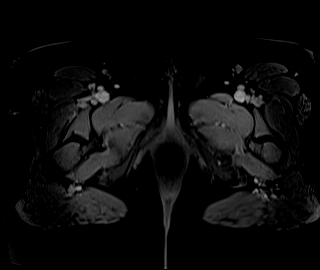
[im 52/104]
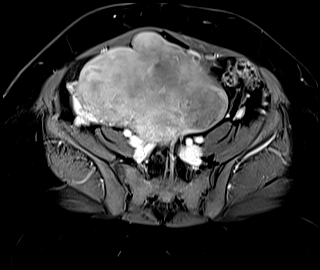
[im 104/104]
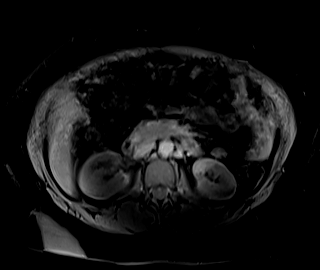

[Series 18: T1 dynamic · axial · 3.0mm · 1.12mm/px · z∈[-203,+106]mm · 3 of 104 slices shown (3 of 4)]
[im 1/104]
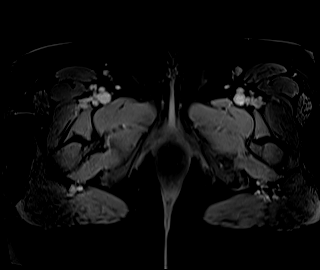
[im 52/104]
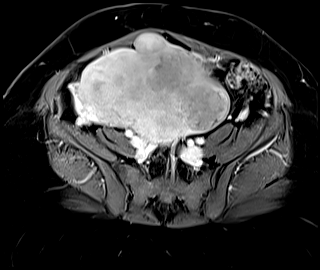
[im 104/104]
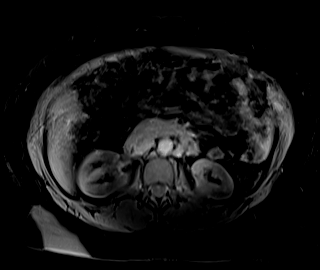

[Series 20: T1 dynamic · axial · 3.0mm · 1.12mm/px · z∈[-203,+106]mm · 3 of 104 slices shown (4 of 4)]
[im 1/104]
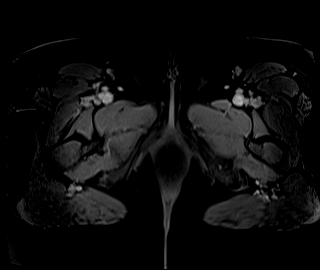
[im 52/104]
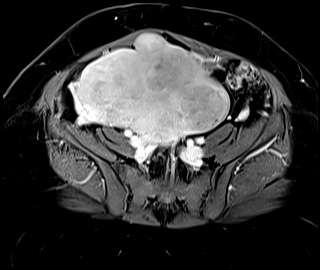
[im 104/104]
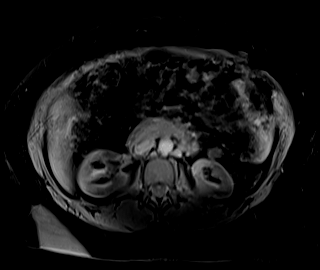

[Series 22: T1 dynamic post-contrast · axial · 3.0mm · 1.19mm/px · z∈[-203,+106]mm · 3 of 104 slices shown (1 of 2)]
[im 1/104]
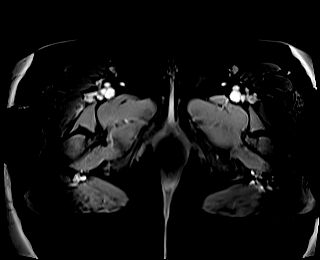
[im 52/104]
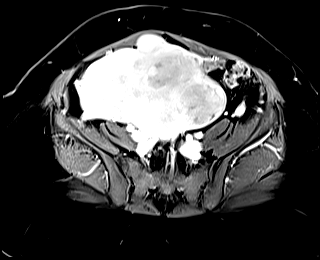
[im 104/104]
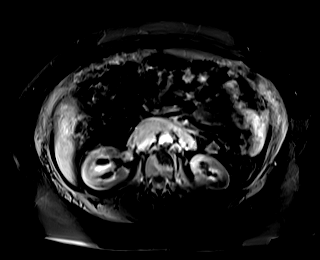

[Series 24: T1 dynamic post-contrast · sagittal · 3.0mm · 1.03mm/px · 3 of 80 slices shown (2 of 2)]
[im 1/80]
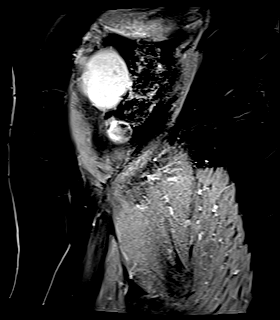
[im 40/80]
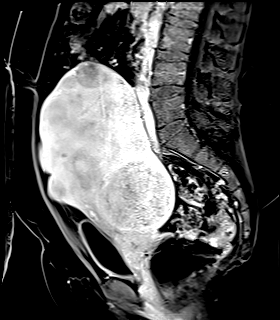
[im 80/80]
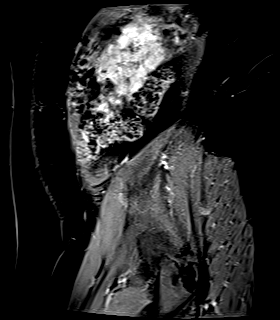

[Series 25: T2 post-contrast · sagittal · 5.0mm · 0.55mm/px · 1 of 43 slices shown]
[im 1/43]
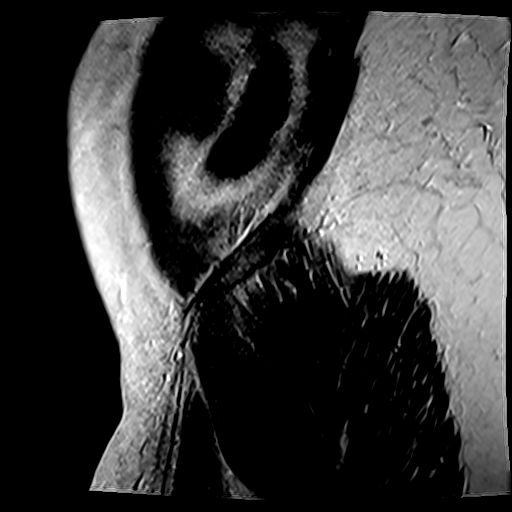

[Series 100: 1 min sub · axial · 3.0mm · 1.12mm/px · 1 of 104 slices shown]
[im 1/104]
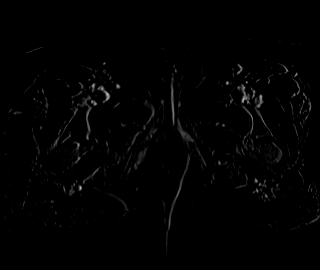

[40 of 48 positions shown; findings below may reference images not displayed]

FINDINGS: Lower Urinary Tract: No bladder or urethral abnormality identified.

Bowel:  Unremarkable visualized pelvic bowel loops.

Vascular/Lymphatic: No pathologically enlarged lymph nodes or other
significant abnormality.

Reproductive:

-- Uterus: Measures 24.3 by 12.2 by 19.1 cm (volume = 3681 cm^3).
Diffuse involvement the uterus by innumerable fibroids is
demonstrated. These fibroids range in size from less than 1 cm to
the largest in the left uterine fundus which measures 9.6 cm in
maximum diameter.

-- Intracavitary fibroids: A single intracavitary fibroid is seen
midportion of the endometrial cavity arising from the left posterior
wall which measures 4.8 cm in maximum diameter.

-- Pedunculated fibroids: Several dunk elated fibroids are seen
arising from the uterine fundus, most of which have broad bases of
attachment to the uterine myometrium. The largest pedunculated
fibroid arises from the right uterine fundus measuring 6.7 cm and
this has a relatively thinner soft tissue pedicle which measures
cm in thickness on image [DATE].

-- Fibroid contrast enhancement: All fibroids show contrast
enhancement, with only a few showing small areas of cystic
degeneration.

-- Right ovary: Appears normal. 2.2 cm right ovarian follicle noted.
No mass identified.

-- Left ovary:  Appears normal.  no mass identified.

Other: No abnormal free fluid.

Musculoskeletal:  Unremarkable.
IMPRESSION: Markedly enlarged uterus by diffuse involvement by innumerable
fibroids, largest measuring 9.6 cm.

4.8 cm intracavitary fibroid in the midportion of the endometrial
cavity.

Several pedunculated fibroids arising from the uterine fundus.
Largest pedunculated fibroid arising from the right uterine fundus
measures 6.7 cm and has a relatively thin soft tissue pedicle of
attachment measuring 2.7 cm in thickness.

Normal appearance of both ovaries. No adnexal mass identified.
